# Patient Record
Sex: Female | Born: 1985 | Race: Black or African American | Hispanic: No | Marital: Single | Smoking: Never smoker
Health system: Southern US, Community
[De-identification: ages and names within clinical notes are randomized; demographics above are authoritative.]

---

## 2020-12-28 ENCOUNTER — Ambulatory Visit (HOSPITAL_COMMUNITY)
Admission: EM | Admit: 2020-12-28 | Discharge: 2020-12-28 | Disposition: A | Discharge status: Home / Self Care | Payer: No Payment, Other | Attending: Psychiatry | Admitting: Psychiatry

## 2020-12-28 ENCOUNTER — Other Ambulatory Visit: Payer: Self-pay

## 2020-12-28 DIAGNOSIS — F4323 Adjustment disorder with mixed anxiety and depressed mood: Secondary | ICD-10-CM | POA: Insufficient documentation

## 2020-12-28 DIAGNOSIS — Z5900 Homelessness unspecified: Secondary | ICD-10-CM | POA: Insufficient documentation

## 2020-12-28 NOTE — BH Assessment (Signed)
Pt states she was recently discharged from a hospital, doesn't know name of hospital, how long she was there or when discharged. She said she came to [facility] bc she saw it when walking by. She denies SI, HI and AVH. She said she wants "assistance" and "tx". Also did not have answer for what county she lives in.

## 2020-12-28 NOTE — Discharge Summary (Signed)
Madison Taylor to be D/C'd home per NP order. Discussed with the patient and all questions fully answered. An After Visit Summary was printed and given to the patient. Patient escorted home and D/C home via private auto.  Dickie La  12/28/2020 4:26 PM

## 2020-12-28 NOTE — Discharge Instructions (Signed)

## 2020-12-28 NOTE — ED Provider Notes (Signed)
[facility]al [facility] Medical Screening Exam  Patient Name: Madison Taylor MRN: [id number] Date of Evaluation: 12/28/20 Chief Complaint:   Diagnosis:  Final diagnoses:  Homelessness  Adjustment disorder with mixed anxiety and depressed mood    History of Present illness: Madison Taylor is a 35 y.o. female.  Patient presents voluntarily as a walk-in reporting that she came here to receive treatment.  Patient reports that she was recently discharged from a another hospital but does not remember the name.  She states that she has paperwork in her book bag and when she provided it to me shows that she was recently discharged from the [facility] health system.  Only medications patient was prescribed from that discharge was Norvasc for high blood pressure.  Patient reports that she left from there because they told her she had to leave because she was not coming out of her room to receive treatment.  Patient reports today that she is not suicidal nor homicidal and denies any hallucinations.  Patient states that she is wanting to receive therapy services.  She is informed that she can present to the open access at the [facility] if she is moving to the [facility] area.  Patient reports that she is homeless and that she is needing some resources for shelters in the [place] area.  Patient will be provided with shelter resources and is also provided with hours of operation for the open access at the [facility].  Psychiatric Specialty Exam  Presentation  General Appearance:Appropriate for Environment; Casual  Eye Contact:Good  Speech:Clear and Coherent; Normal Rate  Speech Volume:Normal  Handedness:Right   Mood and Affect  Mood:Euthymic; Anxious  Affect:Appropriate; Congruent   Thought Process  Thought Processes:Coherent  Descriptions of Associations:Intact  Orientation:Full (Time, Place and Person)  Thought Content:WDL    Hallucinations:None  Ideas of Reference:None  Suicidal  Thoughts:No  Homicidal Thoughts:No   Sensorium  Memory:Immediate Good; Recent Good; Remote Good  Judgment:Fair  Insight:Fair   Executive Functions  Concentration:Good  Attention Span:No data recorded Recall:Good  Fund of Knowledge:Fair  Language:Good   Psychomotor Activity  Psychomotor Activity:Normal   Assets  Assets:Communication Skills; Desire for Improvement; Housing   Sleep  Sleep:Good  Number of hours: No data recorded  No data recorded  Physical Exam: Physical Exam Vitals and nursing note reviewed.  Constitutional:      Appearance: She is well-developed.  HENT:     Head: Normocephalic.  Eyes:     Pupils: Pupils are equal, round, and reactive to light.  Cardiovascular:     Rate and Rhythm: Normal rate.  Pulmonary:     Effort: Pulmonary effort is normal.  Musculoskeletal:        General: Normal range of motion.  Neurological:     Mental Status: She is alert and oriented to person, place, and time.    Review of Systems  Constitutional: Negative.   HENT: Negative.   Eyes: Negative.   Respiratory: Negative.   Cardiovascular: Negative.   Gastrointestinal: Negative.   Genitourinary: Negative.   Musculoskeletal: Negative.   Skin: Negative.   Neurological: Negative.   Endo/Heme/Allergies: Negative.   Psychiatric/Behavioral: Negative.    Blood pressure 134/80, pulse 98, temperature 98.2 F (36.8 C), temperature source Oral, resp. rate 18, SpO2 100 %. There is no height or weight on file to calculate BMI.  Musculoskeletal: Strength & Muscle Tone: within normal limits Gait & Station: normal Patient leans: N/A   [facility] MSE Discharge Disposition for Follow up and Recommendations: Based  on my evaluation the patient does not appear to have an emergency medical condition and can be discharged with resources and follow up care in outpatient services for Individual Therapy   Maryfrances Bunnell, FNP 12/28/2020, 4:13 PM

## 2020-12-30 ENCOUNTER — Other Ambulatory Visit: Payer: Self-pay

## 2020-12-30 ENCOUNTER — Emergency Department (HOSPITAL_COMMUNITY)
Admission: EM | Admit: 2020-12-30 | Discharge: 2020-12-31 | Disposition: A | Discharge status: Home / Self Care | Payer: Medicaid Other | Attending: Emergency Medicine | Admitting: Emergency Medicine

## 2020-12-30 ENCOUNTER — Encounter (HOSPITAL_COMMUNITY): Payer: Self-pay

## 2020-12-30 DIAGNOSIS — Z5900 Homelessness unspecified: Secondary | ICD-10-CM

## 2020-12-30 DIAGNOSIS — Z20822 Contact with and (suspected) exposure to covid-19: Secondary | ICD-10-CM | POA: Insufficient documentation

## 2020-12-30 DIAGNOSIS — R4182 Altered mental status, unspecified: Secondary | ICD-10-CM | POA: Insufficient documentation

## 2020-12-30 DIAGNOSIS — F4321 Adjustment disorder with depressed mood: Secondary | ICD-10-CM | POA: Diagnosis present

## 2020-12-30 DIAGNOSIS — R000 Tachycardia, unspecified: Secondary | ICD-10-CM | POA: Insufficient documentation

## 2020-12-30 LAB — COMPREHENSIVE METABOLIC PANEL
ALT: 17 U/L (ref 0–44)
AST: 35 U/L (ref 15–41)
Albumin: 4.2 g/dL (ref 3.5–5.0)
Alkaline Phosphatase: 64 U/L (ref 38–126)
Anion gap: 14 (ref 5–15)
BUN: 13 mg/dL (ref 6–20)
CO2: 16 mmol/L — ABNORMAL LOW (ref 22–32)
Calcium: 9 mg/dL (ref 8.9–10.3)
Chloride: 105 mmol/L (ref 98–111)
Creatinine, Ser: 0.76 mg/dL (ref 0.44–1.00)
GFR, Estimated: >60 mL/min (ref >60)
Glucose, Bld: 104 mg/dL — ABNORMAL HIGH (ref 70–99)
Potassium: 4 mmol/L (ref 3.5–5.1)
Sodium: 135 mmol/L (ref 135–145)
Total Bilirubin: 2 mg/dL — ABNORMAL HIGH (ref 0.3–1.2)
Total Protein: 7.6 g/dL (ref 6.5–8.1)

## 2020-12-30 LAB — CK: Total CK: 763 U/L — ABNORMAL HIGH (ref 38–234)

## 2020-12-30 LAB — CBC WITH DIFFERENTIAL/PLATELET
Abs Immature Granulocytes: 0.06 10*3/uL (ref 0.00–0.07)
Basophils Absolute: 0 10*3/uL (ref 0.0–0.1)
Basophils Relative: 0 %
Eosinophils Absolute: 0 10*3/uL (ref 0.0–0.5)
Eosinophils Relative: 0 %
HCT: 35.6 % — ABNORMAL LOW (ref 36.0–46.0)
Hemoglobin: 11.2 g/dL — ABNORMAL LOW (ref 12.0–15.0)
Immature Granulocytes: 0 %
Lymphocytes Relative: 19 %
Lymphs Abs: 2.6 10*3/uL (ref 0.7–4.0)
MCH: 25.6 pg — ABNORMAL LOW (ref 26.0–34.0)
MCHC: 31.5 g/dL (ref 30.0–36.0)
MCV: 81.3 fL (ref 80.0–100.0)
Monocytes Absolute: 1 10*3/uL (ref 0.1–1.0)
Monocytes Relative: 8 %
Neutro Abs: 9.7 10*3/uL — ABNORMAL HIGH (ref 1.7–7.7)
Neutrophils Relative %: 73 %
Platelets: 307 10*3/uL (ref 150–400)
RBC: 4.38 MIL/uL (ref 3.87–5.11)
RDW: 15.2 % (ref 11.5–15.5)
WBC: 13.4 10*3/uL — ABNORMAL HIGH (ref 4.0–10.5)
nRBC: 0 % (ref 0.0–0.2)

## 2020-12-30 LAB — BLOOD GAS, VENOUS
Acid-base deficit: 9.6 mmol/L — ABNORMAL HIGH (ref 0.0–2.0)
Bicarbonate: 17 mmol/L — ABNORMAL LOW (ref 20.0–28.0)
O2 Saturation: 64.2 %
Patient temperature: 98.6
pCO2, Ven: 42.3 mmHg — ABNORMAL LOW (ref 44.0–60.0)
pH, Ven: 7.229 — ABNORMAL LOW (ref 7.250–7.430)
pO2, Ven: 40.7 mmHg (ref 32.0–45.0)

## 2020-12-30 LAB — HCG, SERUM, QUALITATIVE: Preg, Serum: NEGATIVE

## 2020-12-30 LAB — ETHANOL: Alcohol, Ethyl (B): <10 mg/dL (ref <10)

## 2020-12-30 LAB — SALICYLATE LEVEL: Salicylate Lvl: <7 mg/dL — ABNORMAL LOW (ref 7.0–30.0)

## 2020-12-30 LAB — ACETAMINOPHEN LEVEL: Acetaminophen (Tylenol), Serum: <10 ug/mL — ABNORMAL LOW (ref 10–30)

## 2020-12-30 MED ORDER — LORAZEPAM 2 MG/ML IJ SOLN
0.5000 mg | Freq: Once | INTRAMUSCULAR | Status: AC
  Administered 2020-12-30: 0.5 mg via INTRAVENOUS
  Filled 2020-12-30: qty 1

## 2020-12-30 MED ORDER — SODIUM CHLORIDE 0.9 % IV BOLUS
1000.0000 mL | Freq: Once | INTRAVENOUS | Status: AC
  Administered 2020-12-30: 1000 mL via INTRAVENOUS

## 2020-12-30 MED ORDER — IBUPROFEN 800 MG PO TABS
800.0000 mg | ORAL_TABLET | Freq: Once | ORAL | Status: AC
  Administered 2020-12-30: 800 mg via ORAL
  Filled 2020-12-30: qty 1

## 2020-12-30 NOTE — ED Notes (Addendum)
Pt is refusing to have labs done or any meds. Asked for some peanut butter and water. Pt has been very pleasant but does not speak much. Pt also not allowing staff to do EKG or vitals at times.

## 2020-12-30 NOTE — ED Notes (Signed)
Pt is still refusing treatment

## 2020-12-30 NOTE — ED Triage Notes (Addendum)
Pt BIB EMS. Pt decided to lay in a ditch and bystander called [employer]. Pt refused to speak to [employer] and EMS. Pt follows commands. Pt was seen at an ED on 4/1 and diagnosed with adjustment disorder.

## 2020-12-30 NOTE — ED Notes (Signed)
Patient dump her urine sample In the toilet. Pt stated "I never agreed to test my urine".

## 2020-12-30 NOTE — BH Assessment (Signed)
Contacted pt on the Tele-Assessment machine in an attempt to complete her CCA. Pt did not respond to clinician calling her name; clinician requested the assistance of pt's nurse, Junior RN, who also attempted to arouse pt without success. Clinician requested pt's nurse contact her via secure internal IM when pt awakens later; pt's nurse agreed to do so.

## 2020-12-30 NOTE — ED Provider Notes (Signed)
[facility]-EMERGENCY DEPT Provider Note   CSN: [id number] Arrival date & time: 12/30/20  1429     History Chief Complaint  Patient presents with  . Altered Mental Status    Madison Taylor is a 35 y.o. female.  Presents to ER by EMS.  History limited due to patient nonverbal status.  Patient refusing to provide any history.  Per report, patient was laying in a ditch and bystander called EMS.  Refusing to talk to [employer].  HPI     History reviewed. No pertinent past medical history.  There are no problems to display for this patient.   History reviewed. No pertinent surgical history.   OB History   No obstetric history on file.     History reviewed. No pertinent family history.     Home Medications Prior to Admission medications   Not on File    Allergies    Patient has no known allergies.  Review of Systems   Review of Systems  Unable to perform ROS: Patient nonverbal    Physical Exam Updated Vital Signs BP (!) 142/110   Pulse 95   Temp 98.1 F (36.7 C) (Oral)   Resp 17   SpO2 100%   Physical Exam Vitals and nursing note reviewed.  Constitutional:      General: She is not in acute distress.    Appearance: She is well-developed.  HENT:     Head: Normocephalic and atraumatic.  Eyes:     Conjunctiva/sclera: Conjunctivae normal.  Cardiovascular:     Rate and Rhythm: Regular rhythm. Tachycardia present.     Heart sounds: No murmur heard.   Pulmonary:     Effort: Pulmonary effort is normal. No respiratory distress.     Breath sounds: Normal breath sounds.  Abdominal:     Palpations: Abdomen is soft.     Tenderness: There is no abdominal tenderness.  Musculoskeletal:     Cervical back: Neck supple.  Skin:    General: Skin is warm and dry.  Neurological:     Mental Status: She is alert.     Comments: Alert, follows commands, does not speak     ED Results / Procedures / Treatments   Labs (all labs ordered are listed, but  only abnormal results are displayed) Labs Reviewed  CBC WITH DIFFERENTIAL/PLATELET - Abnormal; Notable for the following components:      Result Value   WBC 13.4 (*)    Hemoglobin 11.2 (*)    HCT 35.6 (*)    MCH 25.6 (*)    Neutro Abs 9.7 (*)    All other components within normal limits  COMPREHENSIVE METABOLIC PANEL - Abnormal; Notable for the following components:   CO2 16 (*)    Glucose, Bld 104 (*)    Total Bilirubin 2.0 (*)    All other components within normal limits  ACETAMINOPHEN LEVEL - Abnormal; Notable for the following components:   Acetaminophen (Tylenol), Serum <10 (*)    All other components within normal limits  SALICYLATE LEVEL - Abnormal; Notable for the following components:   Salicylate Lvl <7.0 (*)    All other components within normal limits  ETHANOL  HCG, SERUM, QUALITATIVE  URINALYSIS, ROUTINE W REFLEX MICROSCOPIC  RAPID URINE DRUG SCREEN, HOSP PERFORMED  CK  LACTIC ACID, PLASMA  LACTIC ACID, PLASMA  BLOOD GAS, VENOUS  POC URINE PREG, ED    EKG None  Radiology No results found.  Procedures Procedures   Medications Ordered in ED Medications  LORazepam (ATIVAN) injection 0.5 mg (0.5 mg Intravenous Given 12/30/20 1932)  sodium chloride 0.9 % bolus 1,000 mL (0 mLs Intravenous Stopped 12/30/20 2124)  ibuprofen (ADVIL) tablet 800 mg (800 mg Oral Given 12/30/20 1933)  sodium chloride 0.9 % bolus 1,000 mL (1,000 mLs Intravenous New Bag/Given 12/30/20 2038)    ED Course  I have reviewed the triage vital signs and the nursing notes.  Pertinent labs & imaging results that were available during my care of the patient were reviewed by me and considered in my medical decision making (see chart for details).    MDM Rules/Calculators/A&P                         35 year old presents to ER after she was picked up by EMS lying in a ditch.  In ER, she initially was refusing to speak.  Noted to be tachycardic but otherwise vitals were stable.  She eventually did  speak albeit very little.  She did deny SI or HI or AVH.  Initially refusing labs but later was willing to accept lab work.  She was noted to have mild leukocytosis as well as a mildly low bicarbonate level.  Suspect dehydration.  Provided fluids.  Patient refusing to speak with TTS.  On reassessment, she still would not provide any additional history.  Will broaden work-up for altered mental status and possible acidosis.  Will check VBG, lactate, CK, CT head.  While awaiting further testing and reassessment, patient signed out to Dr. Adela Lank.  Final Clinical Impression(s) / ED Diagnoses Final diagnoses:  Altered mental status, unspecified altered mental status type    Rx / DC Orders ED Discharge Orders    None       Milagros Loll, MD 12/30/20 [phone]

## 2020-12-30 NOTE — BH Assessment (Signed)
TTS attempted to see this patient for assessment.  Requested RN-Ellen to set up TTS cart for patient at 16:50 pm and even spoke to Darl Pikes , Consulting civil engineer to help get cart set up at 17:40.  The cart has still not been set up at his time. (18:05)

## 2020-12-30 NOTE — ED Notes (Signed)
TTS machine has been in room for an hour, pt has covers pulled up over head and did not want to talk when writer took TTS machine in room.

## 2020-12-31 ENCOUNTER — Other Ambulatory Visit (HOSPITAL_COMMUNITY): Payer: Medicaid Other

## 2020-12-31 ENCOUNTER — Emergency Department (HOSPITAL_COMMUNITY): Payer: Medicaid Other

## 2020-12-31 DIAGNOSIS — F4321 Adjustment disorder with depressed mood: Secondary | ICD-10-CM | POA: Diagnosis present

## 2020-12-31 LAB — BASIC METABOLIC PANEL
Anion gap: 8 (ref 5–15)
BUN: 9 mg/dL (ref 6–20)
CO2: 17 mmol/L — ABNORMAL LOW (ref 22–32)
Calcium: 7.7 mg/dL — ABNORMAL LOW (ref 8.9–10.3)
Chloride: 113 mmol/L — ABNORMAL HIGH (ref 98–111)
Creatinine, Ser: 0.52 mg/dL (ref 0.44–1.00)
GFR, Estimated: >60 mL/min (ref >60)
Glucose, Bld: 73 mg/dL (ref 70–99)
Potassium: 3.8 mmol/L (ref 3.5–5.1)
Sodium: 138 mmol/L (ref 135–145)

## 2020-12-31 LAB — RESP PANEL BY RT-PCR (FLU A&B, COVID) ARPGX2
Influenza A by PCR: NEGATIVE
Influenza B by PCR: NEGATIVE
SARS Coronavirus 2 by RT PCR: NEGATIVE

## 2020-12-31 LAB — LACTIC ACID, PLASMA
Lactic Acid, Venous: 1.4 mmol/L (ref 0.5–1.9)
Lactic Acid, Venous: 0.7 mmol/L (ref 0.5–1.9)

## 2020-12-31 LAB — BETA-HYDROXYBUTYRIC ACID: Beta-Hydroxybutyric Acid: 2.03 mmol/L — ABNORMAL HIGH (ref 0.05–0.27)

## 2020-12-31 MED ORDER — IBUPROFEN 800 MG PO TABS
800.0000 mg | ORAL_TABLET | Freq: Once | ORAL | Status: AC
  Administered 2020-12-31: 800 mg via ORAL
  Filled 2020-12-31: qty 1

## 2020-12-31 MED ORDER — LACTATED RINGERS IV BOLUS
1000.0000 mL | Freq: Once | INTRAVENOUS | Status: AC
  Administered 2020-12-31: 1000 mL via INTRAVENOUS

## 2020-12-31 NOTE — Progress Notes (Addendum)
..   Transition of Care [facility]) - Emergency Department Mini Assessment   Patient Details  Name: Madison Taylor MRN: [id number] Date of Birth: Nov 12, 1985  Transition of [facility]) CM/SW Contact:    Lossie Faes Tarpley-Carter, LCSWA Phone Number: 12/31/2020, 1:38 PM   Clinical Narrative: TOC CSW spoke with Alvis Lemmings, Charity fundraiser. Dawn would give pt resources to assist with homelessness.  Dosia Yodice Tarpley-Carter, MSW, LCSW-A Pronouns:  She, Her, Hers                  [facility] ED Transitions of CareClinical Social Worker [email or web address] [phone]  ED Mini Assessment: What brought you to the Emergency Department? : Altered Mental Status  Barriers to Discharge: No Barriers Identified     Means of departure: Not know  Interventions which prevented an admission or readmission: Homeless Screening    Patient Contact and Communications        ,          Patient states their goals for this hospitalization and ongoing recovery are:: To obtain resources for homelessness. CMS Medicare.gov Compare Post Acute Care list provided to:: Patient Choice offered to / list presented to : NA  Admission diagnosis:  AMS Patient Active Problem List   Diagnosis Date Noted  . Adjustment disorder with depressed mood 12/31/2020   PCP:  Pcp, No Pharmacy:   [id number] - [place], [place] - [address] AT [facility] [address] [place] [address] Phone: [phone] Fax: [phone]

## 2020-12-31 NOTE — TOC Initial Note (Addendum)
12/31/2020 600 pm   Transition of Care (TOC) - Initial/Assessment Note    Patient Details  Name: Madison Taylor MRN: [id number] Date of Birth: [birth year]  [facility]) CM/SW Contact:    Elliot Cousin, RN Phone Number: [phone] 12/31/2020, 9:19 PM  Clinical Narrative:                 TOC CM spoke to pt at bedside. Pt was tearful and guarded. States she does not want to leave.  Attempted to discuss with pt family support, income or job. Pt was did not provided any information on family or friends that can assist her in the community. States she slept on the street in a ditch. Pt does have list of shelter resources. Provided pt with bus pass, 3 water bottles, sandwich, crackers and $5 to assist with getting water. Pt gave permission for [facility] to assist with homeless and food.     Barriers to Discharge: No Barriers Identified   Patient Goals and CMS Choice Patient states their goals for this hospitalization and ongoing recovery are:: To obtain resources for homelessness. CMS Medicare.gov Compare Post Acute Care list provided to:: Patient Choice offered to / list presented to : NA  Expected Discharge Plan and Services    Prior Living Arrangements/Services  Activities of Daily Living      Permission Sought/Granted  Emotional Assessment              Admission diagnosis:  AMS Patient Active Problem List   Diagnosis Date Noted  . Adjustment disorder with depressed mood 12/31/2020   PCP:  Pcp, No Pharmacy:   [id number] - [place], [place] - [address] AT [facility] [address] [place] [address] Phone: [phone] Fax: [phone]     Social Determinants of Health (SDOH) Interventions    Readmission Risk Interventions No flowsheet data found.

## 2020-12-31 NOTE — Progress Notes (Signed)
TOC CM created [facility] referral. Isidoro Donning RN CCM, [facility] ED TOC CM [phone]

## 2020-12-31 NOTE — Discharge Instructions (Signed)
Please return for any problem.  °

## 2020-12-31 NOTE — BH Assessment (Signed)
Comprehensive Clinical Assessment (CCA) Note  12/31/2020 Madison Taylor [id number]  Recommendations for Services/Supports/Treatments: Melbourne Abts, PA, reviewed pt's chart and information and determined pt should be observed in the ED for safety and stability and re-assessed in the morning by psychiatry. This information was relayed to pt's nurse, Dawn RN at [phone].  The patient demonstrates the following risk factors for suicide: Chronic risk factors for suicide include: psychiatric disorder of Adjustment Disorder. Acute risk factors for suicide include: recent discharge from inpatient psychiatry. Protective factors for this patient include: hope for the future. Considering these factors, the overall suicide risk at this point appears to be none. Patient is appropriate for outpatient follow up.  Therefore, no sitter is deemed necessary for suicide precautions.   Flowsheet Row ED from 12/30/2020 in [facility]Y DE[facility]  C-SSRS RISK CATEGORY No Risk      Chief Complaint:  Chief Complaint  Patient presents with  . Altered Mental Status   Visit Diagnosis: F43.25, Adjustment disorder, With mixed disturbance of emotions and conduct  CCA Screening, Triage and Referral (STR) Madison Taylor is a 35 year old patient who was brought to the [facility] via EMS due to pt lying in a ditch, resulting in a bystander calling [employer]. "Pt refused to speak to [employer] and EMS. "Pt follows commands. Pt was seen at an ED on 4/01 and dx with adjustment disorder."  Pt did not initially answer questions and required multiple re-directions to interact. When asked why she was in the hospital, she stated, "I'm dehydrated." When asked what we can do to help, pt stated, "just give me some medicine to see if that works. I've had pains all day. I was given ibuprofen." When asked if the medicine helped, she stated, "last night I slept so I don't know if it did but I just got up a few minutes ago and I still hurt."  Pt  denies SI or a hx of SI. She denies HI, AVH, NSSIB, access to guns/weapons, engagement with the legal system, or SA. As of now, pt has not provided a urine sample for a UDA.  Pt's orientation and memory were UTA. Pt was flat and required multiple re-directions to stay awake to complete the assessment. Pt's insight, judgment, and impulse control is fair - poor at this time.    Patient Reported Information How did you hear about [place]? Other (Comment) (EMS)  Referral name: EMS  Referral phone number: 0 (N/A)   Whom do you see for routine medical problems? I don't have a doctor  Practice/Facility Name: No data recorded Practice/Facility Phone Number: No data recorded Name of Contact: No data recorded Contact Number: No data recorded Contact Fax Number: No data recorded Prescriber Name: No data recorded Prescriber Address (if known): No data recorded  What Is the Reason for Your Visit/Call Today? Per nursing note: "Pt BIB EMS. Pt decided to lay in a ditch and bystander called [employer]. Pt refused to speak to [employer] and EMS. Pt follows commands. Pt was seen at an ED on 4/1 and diagnosed with adjustment disorder."  How Long Has This Been Causing You Problems? <Week  What Do You Feel Would Help You the Most Today? -- (Pt shares she is in pain and needs some ibuprofen.)   Have You Recently Been in Any Inpatient Treatment (Hospital/Detox/Crisis Center/28-Day Program)? Yes  Name/Location of Program/Hospital:[facility]  How Long Were You There? Unsure  When Were You Discharged?  (Within the last week or so)   [facility] You [facility]? No data recorded [facility]? No data recorded  Have You Recently Had Any Thoughts About Hurting Yourself? No  Are You Planning to Commit Suicide/Harm Yourself At This time? No   Have you Recently Had Thoughts About Hurting Someone Karolee Ohslse? No  Explanation: No data recorded  Have You Used Any Alcohol or Drugs in the  Past 24 Hours? No  How Long Ago Did You Use Drugs or Alcohol? No data recorded What Did You Use and How Much? No data recorded  Do You Currently Have a Therapist/Psychiatrist? No  Name of Therapist/Psychiatrist: No data recorded  Have You Been Recently Discharged From Any Office Practice or Programs? No  Explanation of Discharge From Practice/Program: No data recorded    CCA Screening Triage Referral Assessment Type of Contact: Tele-Assessment  Is this Initial or Reassessment? Initial Assessment  Date Telepsych consult ordered in [facility]:  12/30/2020  [facility]  [facility]   Patient Reported Information Reviewed? Yes  Patient Left Without Being Seen? No data recorded Reason for Not Completing Assessment: No data recorded  Collateral Involvement: Pt denies she has friends/family that clinician can contact for collateral information.   Does Patient Have a Automotive engineerCourt Appointed Legal Guardian? No data recorded Name and Contact of Legal Guardian: No data recorded If Minor and Not Living with Parent(s), Who has Custody? N/A  Is CPS involved or ever been involved? -- (UTA)  Is APS involved or ever been involved? -- (UTA)   Patient Determined To Be At Risk for Harm To Self or Others Based on Review of Patient Reported Information or Presenting Complaint? No  Method: No data recorded Availability of Means: No data recorded Intent: No data recorded Notification Required: No data recorded Additional Information for Danger to Others Potential: No data recorded Additional Comments for Danger to Others Potential: No data recorded Are There Guns or Other Weapons in Your Home? No data recorded Types of Guns/Weapons: No data recorded Are These Weapons Safely Secured?                            No data recorded Who Could Verify You Are Able To Have These Secured: No data recorded Do You Have any Outstanding Charges, Pending Court Dates, Parole/Probation? No data  recorded Contacted To Inform of Risk of Harm To Self or Others: -- (N/A)   Location of Assessment: [facility] ED   Does Patient Present under Involuntary Commitment? No  IVC Papers Initial File Date: No data recorded  [place]   Patient Currently Receiving the Following Services: Not Receiving Services   Determination of Need: Urgent (48 hours)   Options For Referral: Outpatient Therapy     CCA Biopsychosocial Intake/Chief Complaint:  Per nursing note: "Pt BIB EMS. Pt decided to lay in a ditch and bystander called [employer]. Pt refused to speak to [employer] and EMS. Pt follows commands. Pt was seen at an ED on 4/1 and diagnosed with adjustment disorder."  Current Symptoms/Problems: Pt states she is in pain.   Patient Reported Schizophrenia/Schizoaffective Diagnosis in Past: No   Strengths: Not assessed  Preferences: Not assessed  Abilities: Not assessed   Type of Services Patient Feels are Needed: Pt expressed needing some ibuprofen for pain; she also shared she was cold and hungry.   Initial Clinical Notes/Concerns: N/A   Mental Health Symptoms Depression:  None   Duration of  Depressive symptoms: No data recorded  Mania:  Recklessness   Anxiety:   None   Psychosis:  Affective flattening/alogia/avolition; Grossly disorganized speech; Other negative symptoms   Duration of Psychotic symptoms: Less than six months   Trauma:  None   Obsessions:  None   Compulsions:  None   Inattention:  None   Hyperactivity/Impulsivity:  N/A   Oppositional/Defiant Behaviors:  None   Emotional Irregularity:  Potentially harmful impulsivity   Other Mood/Personality Symptoms:  None noted    Mental Status Exam Appearance and self-care  Stature:  Average   Weight:  Average weight   Clothing:  -- (Pt was dressed in scrubs)   Grooming:  Normal   Cosmetic use:  None   Posture/gait:  Normal   Motor activity:  Not Remarkable   Sensorium  Attention:   Inattentive   Concentration:  Variable   Orientation:  -- (UTA)   Recall/memory:  -- (Pt was slow to answer questions/provide information)   Affect and Mood  Affect:  Blunted; Depressed; Flat   Mood:  Depressed   Relating  Eye contact:  Avoided   Facial expression:  Constricted   Attitude toward examiner:  Guarded   Thought and Language  Speech flow: Slow   Thought content:  Appropriate to Mood and Circumstances   Preoccupation:  None   Hallucinations:  None   Organization:  No data recorded  [employer] of Knowledge:  Average   Intelligence:  Average   Abstraction:  -- (UTA)   Judgement:  Impaired   Reality Testing:  -- (UTA)   Insight:  Gaps   Decision Making:  Impulsive   Social Functioning  Social Maturity:  Impulsive   Social Judgement:  Heedless   Stress  Stressors:  -- (UTA)   Coping Ability:  -- Industrial/product designer)   Skill Deficits:  Communication; Self-care; Self-control   Supports:  -- Industrial/product designer)     Religion: Religion/Spirituality Are You A Religious Person?:  (Not assessed) How Might This Affect Treatment?: Not assessed  Leisure/Recreation: Leisure / Recreation Do You Have Hobbies?:  (Not assessed)  Exercise/Diet: Exercise/Diet Do You Exercise?:  (Not assessed) Have You Gained or Lost A Significant Amount of Weight in the Past Six Months?:  (Not assessed) Do You Follow a Special Diet?:  (Not assessed) Do You Have Any Trouble Sleeping?:  (Not assessed)   CCA Employment/Education Employment/Work Situation: Employment / Work Situation Employment situation:  (Not assessed) Patient's job has been impacted by current illness:  (Not assessed) What is the longest time patient has a held a job?: Not assessed Where was the patient employed at that time?: Not assessed Has patient ever been in the [employer]?:  (Not assessed)  Education: Education Is Patient Currently Attending School?:  (Not assessed) Last Grade Completed:  (Not  assessed) Name of High School: Not assessed Did 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[employer]?:  (Not assessed) Did You Attend College?:  (Not assessed) Did You Have Any Special Interests In School?: Not assessed Did You Have An Individualized Education Program (IIEP):  (Not assessed) Did You Have Any Difficulty At School?:  (Not assessed) Patient's Education Has Been Impacted by Current Illness:  (Not assessed)   CCA Family/Childhood History Family and Relationship History: Family history Marital status:  (Not assessed) Are you sexually active?:  (Not assessed) What is your sexual orientation?: Not assessed Has your sexual activity been affected by drugs, alcohol, medication, or emotional stress?: Not assessed Does patient have children?:  (Not assessed)  Childhood  History:  Childhood History By whom was/is the patient raised?:  (Not assessed) Additional childhood history information: Not assessed Description of patient's relationship with caregiver when they were a child: Not assessed Patient's description of current relationship with people who raised him/her: Not assessed How were you disciplined when you got in trouble as a child/adolescent?: Not assessed Does patient have siblings?:  (Not assessed) Did patient suffer any verbal/emotional/physical/sexual abuse as a child?:  (Not assessed) Did patient suffer from severe childhood neglect?:  (Not assessed) Has patient ever been sexually abused/assaulted/raped as an adolescent or adult?:  (Not assessed) Was the patient ever a victim of a crime or a disaster?:  (Not assessed) Witnessed domestic violence?:  (Not assessed) Has patient been affected by domestic violence as an adult?:  (Not assessed)  Child/Adolescent Assessment:     CCA Substance Use Alcohol/Drug Use: Alcohol / Drug Use Pain Medications: Please see MAR Prescriptions: Please see MAR Over the Counter: Please see MAR History of alcohol / drug use?:  (UTA - pt denies SA but has  not yet provided a urine sample) Longest period of sobriety (when/how long): Unknown Negative Consequences of Use:  (Unknown) Withdrawal Symptoms:  (Unknown)                         ASAM's:  Six Dimensions of Multidimensional Assessment  Dimension 1:  Acute Intoxication and/or Withdrawal Potential:      Dimension 2:  Biomedical Conditions and Complications:      Dimension 3:  Emotional, Behavioral, or Cognitive Conditions and Complications:     Dimension 4:  Readiness to Change:     Dimension 5:  Relapse, Continued use, or Continued Problem Potential:     Dimension 6:  Recovery/Living Environment:     ASAM Severity Score:    ASAM Recommended Level of Treatment: ASAM Recommended Level of Treatment:  (Unknown)   Substance use Disorder (SUD) Substance Use Disorder (SUD)  Checklist Symptoms of Substance Use:  (Unknown)  Recommendations for Services/Supports/Treatments: Recommendations for Services/Supports/Treatments Recommendations For Services/Supports/Treatments: Other (Comment) (Observation at [facility])  Melbourne Abts, PA, reviewed pt's chart and information and determined pt should be observed in the ED for safety and stability and re-assessed in the morning by psychiatry. This information was relayed to pt's nurse, Dawn RN at [phone].  DSM5 Diagnoses: F43.25, Adjustment disorder, With mixed disturbance of emotions and conduct  Patient Centered Plan: Patient is on the following Treatment Plan(s):  None   Referrals to Alternative Service(s): Referred to Alternative Service(s):   Place:   Date:   Time:    Referred to Alternative Service(s):   Place:   Date:   Time:    Referred to Alternative Service(s):   Place:   Date:   Time:    Referred to Alternative Service(s):   Place:   Date:   Time:     Ralph Dowdy, LMFT

## 2020-12-31 NOTE — ED Provider Notes (Signed)
I received the patient in signout from Dr. Stevie Kern.  Briefly the patient is a 35 year old female with a chief complaints of altered mental status.  Found to have a metabolic acidosis.  Plan for continued fluid administration further laboratory evaluation to try and assess for the cause of her acidosis.  VBG with a pH of 723.  Likely ketoacidosis with negative lactate alcohol negative beta hydroxybutyric acid of 2.  We will give 3rd L of IV fluids and recheck a metabolic panel.  I discussed the lab results with the patient.  She denies any toxic alcohol ingestion.  States that she has been able to eat and drink somewhat.  Describes aching all over her body.  Was given ibuprofen yesterday with some improvement.  Bicarb very mildly improved, now hyperchloremic, which could be complicating results.  Patient seems to have improved mentally.  ?ingestion.  As the patient was recently seen with concern for acute mental illness and referred for outpatient.  Will have TTS Ledon Snare, DO 12/31/20 2001

## 2020-12-31 NOTE — ED Notes (Signed)
Pt refusing to leave, educated on dc instructions and homeless resources

## 2020-12-31 NOTE — Consult Note (Signed)
Telepsych Consultation   Reason for Consult:  EDP request  Consult for altered mental state on admission. Tele assessment Referring Physician:  EDP Location of Patient: [facility] Location of Provider: [facility]  Patient Identification: Madison Taylor MRN:  [id number] Principal Diagnosis: Adjustment disorder with depressed mood Diagnosis:  Principal Problem:   Adjustment disorder with depressed mood   Total Time spent with patient: 20 minutes  Subjective:   Madison Taylor is a 35 y.o. female seen today per tele visit. Patient reports that she came to hospital for "physical and emotional treatment and that her body hurts all over".   Denies suicidal and homicidal ideation. Denies auditory and visual ideation. Denies any alcohol or drug use.  Denies any home medications. States, " I do not want to take medications and that's all any one wants to do is to put me on medications".  Patient states that she lives alone.When asked if homeless, patient declined to answer. She did agree to homeless resources from [employer].  CSW will follow up with homeless shelter resources.   Patient declines to give any collateral information. States, " I do not have anyone that I want you to call".  HPI:    EDP provider note, "Madison Taylor is a 35 y.o. female.  Presents to ER by EMS. History limited due to patient nonverbal status.  Patient refusing to provide any history. Per report, patient was laying in a ditch and bystander called EMS.  Refusing to talk to [employer]".  Past Psychiatric History:   Risk to Self:   Risk to Others:   Prior Inpatient Therapy:   Prior Outpatient Therapy:    Past Medical History: History reviewed. No pertinent past medical history. History reviewed. No pertinent surgical history. Family History: History reviewed. No pertinent family history. Family Psychiatric  History:  Social History:  Social History   Substance and Sexual Activity  Alcohol  Use None     Social History   Substance and Sexual Activity  Drug Use Not on file    Social History   Socioeconomic History  . Marital status: Single    Spouse name: Not on file  . Number of children: Not on file  . Years of education: Not on file  . Highest education level: Not on file  Occupational History  . Not on file  Tobacco Use  . Smoking status: Not on file  . Smokeless tobacco: Not on file  Substance and Sexual Activity  . Alcohol use: Not on file  . Drug use: Not on file  . Sexual activity: Not on file  Other Topics Concern  . Not on file  Social History Narrative  . Not on file   Social Determinants of Health   Financial Resource Strain: Not on file  Food Insecurity: Not on file  Transportation Needs: Not on file  Physical Activity: Not on file  Stress: Not on file  Social Connections: Not on file   Additional Social History:    Allergies:  No Known Allergies  Labs:  Results for orders placed or performed during the hospital encounter of 12/30/20 (from the past 48 hour(s))  CBC with Differential     Status: Abnormal   Collection Time: 12/30/20  7:20 PM  Result Value Ref Range   WBC 13.4 (H) 4.0 - 10.5 K/uL   RBC 4.38 3.87 - 5.11 MIL/uL   Hemoglobin 11.2 (L) 12.0 - 15.0 g/dL   HCT 82.9 (L) 56.2 - 13.0 %  MCV 81.3 80.0 - 100.0 fL   MCH 25.6 (L) 26.0 - 34.0 pg   MCHC 31.5 30.0 - 36.0 g/dL   RDW 38.1 82.9 - 93.7 %   Platelets 307 150 - 400 K/uL   nRBC 0.0 0.0 - 0.2 %   Neutrophils Relative % 73 %   Neutro Abs 9.7 (H) 1.7 - 7.7 K/uL   Lymphocytes Relative 19 %   Lymphs Abs 2.6 0.7 - 4.0 K/uL   Monocytes Relative 8 %   Monocytes Absolute 1.0 0.1 - 1.0 K/uL   Eosinophils Relative 0 %   Eosinophils Absolute 0.0 0.0 - 0.5 K/uL   Basophils Relative 0 %   Basophils Absolute 0.0 0.0 - 0.1 K/uL   Immature Granulocytes 0 %   Abs Immature Granulocytes 0.06 0.00 - 0.07 K/uL    Comment: Performed at [facility], [address], [place], [place] [address]  Comprehensive metabolic panel     Status: Abnormal   Collection Time: 12/30/20  7:20 PM  Result Value Ref Range   Sodium 135 135 - 145 mmol/L   Potassium 4.0 3.5 - 5.1 mmol/L   Chloride 105 98 - 111 mmol/L   CO2 16 (L) 22 - 32 mmol/L   Glucose, Bld 104 (H) 70 - 99 mg/dL    Comment: Glucose reference range applies only to samples taken after fasting for at least 8 hours.   BUN 13 6 - 20 mg/dL   Creatinine, Ser 8.93 0.44 - 1.00 mg/dL   Calcium 9.0 8.9 - 81.0 mg/dL   Total Protein 7.6 6.5 - 8.1 g/dL   Albumin 4.2 3.5 - 5.0 g/dL   AST 35 15 - 41 U/L   ALT 17 0 - 44 U/L   Alkaline Phosphatase 64 38 - 126 U/L   Total Bilirubin 2.0 (H) 0.3 - 1.2 mg/dL   GFR, Estimated >17 >51 mL/min    Comment: (NOTE) Calculated using the CKD-EPI Creatinine Equation (2021)    Anion gap 14 5 - 15    Comment: Performed at [facility], [address], [place], [place] [address]  Ethanol     Status: None   Collection Time: 12/30/20  7:20 PM  Result Value Ref Range   Alcohol, Ethyl (B) <10 <10 mg/dL    Comment: (NOTE) Lowest detectable limit for serum alcohol is 10 mg/dL.  For medical purposes only. Performed at [facility], [address], [place], [place] [address]   Acetaminophen level     Status: Abnormal   Collection Time: 12/30/20  7:20 PM  Result Value Ref Range   Acetaminophen (Tylenol), Serum <10 (L) 10 - 30 ug/mL    Comment: (NOTE) Therapeutic concentrations vary significantly. A range of 10-30 ug/mL  may be an effective concentration for many patients. However, some  are best treated at concentrations outside of this range. Acetaminophen concentrations >150 ug/mL at 4 hours after ingestion  and >50 ug/mL at 12 hours after ingestion are often associated with  toxic reactions.  Performed at [facility], [address], [place], [place] [address]   Salicylate level     Status: Abnormal   Collection  Time: 12/30/20  7:20 PM  Result Value Ref Range   Salicylate Lvl <7.0 (L) 7.0 - 30.0 mg/dL    Comment: Performed at [facility], [address], [place], [place] [address]  hCG, serum, qualitative     Status: None   Collection Time: 12/30/20  7:20 PM  Result Value Ref Range   Preg, Serum NEGATIVE NEGATIVE    Comment: Performed at [facility], [address], [place], [place] [address]  CK     Status: Abnormal   Collection Time: 12/30/20 10:31 PM  Result Value Ref Range   Total CK 763 (H) 38 - 234 U/L    Comment: Performed at [facility], [address], [place], [place] [address]  Blood gas, venous (at [facility] and [facility], not at [facility])     Status: Abnormal   Collection Time: 12/30/20 10:55 PM  Result Value Ref Range   pH, Ven 7.229 (L) 7.250 - 7.430   pCO2, Ven 42.3 (L) 44.0 - 60.0 mmHg   pO2, Ven 40.7 32.0 - 45.0 mmHg   Bicarbonate 17.0 (L) 20.0 - 28.0 mmol/L   Acid-base deficit 9.6 (H) 0.0 - 2.0 mmol/L   O2 Saturation 64.2 %   Patient temperature 98.6     Comment: Performed at [facility], [address] [place], [place] [address]  Beta-hydroxybutyric acid     Status: Abnormal   Collection Time: 12/30/20 11:14 PM  Result Value Ref Range   Beta-Hydroxybutyric Acid 2.03 (H) 0.05 - 0.27 mmol/L    Comment: Performed at [facility], [address], [place], [place] [address]  Lactic acid, plasma     Status: None   Collection Time: 12/30/20 11:33 PM  Result Value Ref Range   Lactic Acid, Venous 1.4 0.5 - 1.9 mmol/L    Comment: Performed at [facility], [address], [place], [place] [address]  Lactic acid, plasma     Status: None   Collection Time: 12/31/20 12:33 AM  Result Value Ref Range   Lactic Acid, Venous 0.7 0.5 - 1.9 mmol/L    Comment: Performed at [facility], [address] [place], [place] [address]  Resp Panel by RT-PCR (Flu A&B, Covid)  Nasopharyngeal Swab     Status: None   Collection Time: 12/31/20  4:33 AM   Specimen: Nasopharyngeal Swab; Nasopharyngeal(NP) swabs in vial transport medium  Result Value Ref Range   SARS Coronavirus 2 by RT PCR NEGATIVE NEGATIVE    Comment: (NOTE) SARS-CoV-2 target nucleic acids are NOT DETECTED.  The SARS-CoV-2 RNA is generally detectable in upper respiratory specimens during the acute phase of infection. The lowest concentration of SARS-CoV-2 viral copies this assay can detect is 138 copies/mL. A negative result does not preclude SARS-Cov-2 infection and should not be used as the sole basis for treatment or other patient management decisions. A negative result may occur with  improper specimen collection/handling, submission of specimen other than nasopharyngeal swab, presence of viral mutation(s) within the areas targeted by this assay, and inadequate number of viral copies(<138 copies/mL). A negative result must be combined with clinical observations, patient history, and epidemiological information. The expected result is Negative.  Fact Sheet for Patients:  BloggerCourse.com  Fact Sheet for Healthcare Providers:  SeriousBroker.it  This test is no t yet approved or cleared by the [place] FDA and  has been authorized for detection and/or diagnosis of SARS-CoV-2 by FDA under an Emergency Use Authorization (EUA). This EUA will remain  in effect (meaning this test can be used) for the duration of the COVID-19 declaration under Section 564(b)(1) of the Act, 21 U.S.C.section 360bbb-3(b)(1), unless the authorization is terminated  or revoked sooner.       Influenza A by PCR NEGATIVE NEGATIVE  Influenza B by PCR NEGATIVE NEGATIVE    Comment: (NOTE) The Xpert Xpress SARS-CoV-2/FLU/RSV plus assay is intended as an aid in the diagnosis of influenza from Nasopharyngeal swab specimens and should not be used as a sole basis for  treatment. Nasal washings and aspirates are unacceptable for Xpert Xpress SARS-CoV-2/FLU/RSV testing.  Fact Sheet for Patients: BloggerCourse.com  Fact Sheet for Healthcare Providers: SeriousBroker.it  This test is not yet approved or cleared by the [place] FDA and has been authorized for detection and/or diagnosis of SARS-CoV-2 by FDA under an Emergency Use Authorization (EUA). This EUA will remain in effect (meaning this test can be used) for the duration of the COVID-19 declaration under Section 564(b)(1) of the Act, 21 U.S.C. section 360bbb-3(b)(1), unless the authorization is terminated or revoked.  Performed at [facility], [address] [place], [place] [address]   Basic metabolic panel     Status: Abnormal   Collection Time: 12/31/20  4:47 AM  Result Value Ref Range   Sodium 138 135 - 145 mmol/L   Potassium 3.8 3.5 - 5.1 mmol/L   Chloride 113 (H) 98 - 111 mmol/L   CO2 17 (L) 22 - 32 mmol/L   Glucose, Bld 73 70 - 99 mg/dL    Comment: Glucose reference range applies only to samples taken after fasting for at least 8 hours.   BUN 9 6 - 20 mg/dL   Creatinine, Ser 2.22 0.44 - 1.00 mg/dL   Calcium 7.7 (L) 8.9 - 10.3 mg/dL   GFR, Estimated >97 >98 mL/min    Comment: (NOTE) Calculated using the CKD-EPI Creatinine Equation (2021)    Anion gap 8 5 - 15    Comment: Performed at [facility], [address], [place], [place] [address]    Medications:  No current facility-administered medications for this encounter.   No current outpatient medications on file.    Musculoskeletal: Strength & Muscle Tone: within normal limits Gait & Station: normal Patient leans: N/A  Psychiatric Specialty Exam: Physical Exam Neurological:     Mental Status: She is alert.  Psychiatric:        Attention and Perception: Attention normal. She does not perceive auditory or visual  hallucinations.        Mood and Affect: Mood is not depressed (reports she "is a little sad, but doesnt know why").        Speech: Speech is delayed (delayed at times, reports she is sleepy. Voice is clear and norma rate).        Behavior: Behavior is not slowed (states she is sleepy today ). Behavior is cooperative.        Thought Content: Thought content is not delusional. Thought content does not include homicidal or suicidal ideation.        Cognition and Memory: Cognition normal.     Review of Systems  Psychiatric/Behavioral: Positive for sleep disturbance (reports no issues with sleep but states she is sleepy today ). Negative for agitation, hallucinations and suicidal ideas.    Blood pressure (!) 146/101, pulse 77, temperature 97.8 F (36.6 C), temperature source Oral, resp. rate 18, SpO2 99 %.There is no height or weight on file to calculate BMI.  General Appearance: Fairly Groomed  Eye Contact:  Minimal  Speech:  Slow  Volume:  Decreased  Mood:  Depressed  Affect:  Congruent  Thought Process:  Goal Directed  Orientation:  Full (Time, Place, and Person)  Thought Content:  WDL  Suicidal Thoughts:  No  Homicidal Thoughts:  No  Memory:  Immediate;   Good  Judgement:  Fair  Insight:  Fair  Psychomotor Activity:  Normal  Concentration:  Concentration: Good  Recall:  Good  Fund of Knowledge:  Good  Language:  Good  Akathisia:  Negative  Handed:  Right  AIMS (if indicated):     Assets:  Desire for Improvement Leisure Time Physical Health Resilience  ADL's:  Intact  Cognition:  WNL  Sleep:   reports no issues with sleep, but she is sleepy today      Recommend Discharge  CSW to follow up with homeless shelter resources   Patient declines to provide collateral information.   Disposition: No evidence of imminent risk to self or others at present.   Patient does not meet criteria for psychiatric inpatient admission. Supportive therapy provided about ongoing  stressors. Refer to IOP.  This service was provided via telemedicine using a 2-way, interactive audio and video technology.  Names of all persons participating in this telemedicine service and their role in this encounter. Name: Everlene Ballsiara Like  Role: patient   Name: Vernard Gamblesarolyn Etha Stambaugh  Role: PMHNP  Name: Oletta Cohnaneka Lewis  Role: NP  Name:  Role:    Ardis Hughsarolyn H Janeah Kovacich, NP 12/31/2020 11:51 AM

## 2020-12-31 NOTE — Progress Notes (Signed)
Patient refuses ABG. RN and Physician aware.

## 2021-01-01 ENCOUNTER — Other Ambulatory Visit: Payer: Self-pay

## 2021-01-01 ENCOUNTER — Emergency Department (HOSPITAL_COMMUNITY)
Admission: EM | Admit: 2021-01-01 | Discharge: 2021-01-01 | Disposition: A | Discharge status: Home / Self Care | Payer: Medicaid Other | Attending: Emergency Medicine | Admitting: Emergency Medicine

## 2021-01-01 ENCOUNTER — Encounter (HOSPITAL_COMMUNITY): Payer: Self-pay

## 2021-01-01 DIAGNOSIS — R059 Cough, unspecified: Secondary | ICD-10-CM | POA: Insufficient documentation

## 2021-01-01 DIAGNOSIS — Z5900 Homelessness unspecified: Secondary | ICD-10-CM | POA: Insufficient documentation

## 2021-01-01 DIAGNOSIS — Z20822 Contact with and (suspected) exposure to covid-19: Secondary | ICD-10-CM | POA: Insufficient documentation

## 2021-01-01 DIAGNOSIS — J00 Acute nasopharyngitis [common cold]: Secondary | ICD-10-CM | POA: Insufficient documentation

## 2021-01-01 DIAGNOSIS — F4321 Adjustment disorder with depressed mood: Secondary | ICD-10-CM

## 2021-01-01 DIAGNOSIS — M791 Myalgia, unspecified site: Secondary | ICD-10-CM

## 2021-01-01 DIAGNOSIS — R519 Headache, unspecified: Secondary | ICD-10-CM | POA: Insufficient documentation

## 2021-01-01 DIAGNOSIS — F332 Major depressive disorder, recurrent severe without psychotic features: Secondary | ICD-10-CM | POA: Insufficient documentation

## 2021-01-01 DIAGNOSIS — D649 Anemia, unspecified: Secondary | ICD-10-CM

## 2021-01-01 DIAGNOSIS — D509 Iron deficiency anemia, unspecified: Secondary | ICD-10-CM | POA: Insufficient documentation

## 2021-01-01 LAB — COMPREHENSIVE METABOLIC PANEL
ALT: 15 U/L (ref 0–44)
AST: 23 U/L (ref 15–41)
Albumin: 3.7 g/dL (ref 3.5–5.0)
Alkaline Phosphatase: 62 U/L (ref 38–126)
Anion gap: 7 (ref 5–15)
BUN: 9 mg/dL (ref 6–20)
CO2: 22 mmol/L (ref 22–32)
Calcium: 9.1 mg/dL (ref 8.9–10.3)
Chloride: 114 mmol/L — ABNORMAL HIGH (ref 98–111)
Creatinine, Ser: 0.63 mg/dL (ref 0.44–1.00)
GFR, Estimated: >60 mL/min (ref >60)
Glucose, Bld: 99 mg/dL (ref 70–99)
Potassium: 3.8 mmol/L (ref 3.5–5.1)
Sodium: 143 mmol/L (ref 135–145)
Total Bilirubin: 0.8 mg/dL (ref 0.3–1.2)
Total Protein: 7 g/dL (ref 6.5–8.1)

## 2021-01-01 LAB — CBC WITH DIFFERENTIAL/PLATELET
Abs Immature Granulocytes: 0.01 10*3/uL (ref 0.00–0.07)
Basophils Absolute: 0 10*3/uL (ref 0.0–0.1)
Basophils Relative: 1 %
Eosinophils Absolute: 0.2 10*3/uL (ref 0.0–0.5)
Eosinophils Relative: 3 %
HCT: 33.1 % — ABNORMAL LOW (ref 36.0–46.0)
Hemoglobin: 10.3 g/dL — ABNORMAL LOW (ref 12.0–15.0)
Immature Granulocytes: 0 %
Lymphocytes Relative: 44 %
Lymphs Abs: 2.4 10*3/uL (ref 0.7–4.0)
MCH: 25.4 pg — ABNORMAL LOW (ref 26.0–34.0)
MCHC: 31.1 g/dL (ref 30.0–36.0)
MCV: 81.7 fL (ref 80.0–100.0)
Monocytes Absolute: 0.4 10*3/uL (ref 0.1–1.0)
Monocytes Relative: 7 %
Neutro Abs: 2.5 10*3/uL (ref 1.7–7.7)
Neutrophils Relative %: 45 %
Platelets: 266 10*3/uL (ref 150–400)
RBC: 4.05 MIL/uL (ref 3.87–5.11)
RDW: 15.4 % (ref 11.5–15.5)
WBC: 5.5 10*3/uL (ref 4.0–10.5)
nRBC: 0 % (ref 0.0–0.2)

## 2021-01-01 LAB — SARS CORONAVIRUS 2 (TAT 6-24 HRS): SARS Coronavirus 2: NEGATIVE

## 2021-01-01 LAB — URINALYSIS, ROUTINE W REFLEX MICROSCOPIC
Bilirubin Urine: NEGATIVE
Glucose, UA: NEGATIVE mg/dL
Ketones, ur: 5 mg/dL — AB
Nitrite: NEGATIVE
Protein, ur: 100 mg/dL — AB
RBC / HPF: >50 RBC/hpf — ABNORMAL HIGH (ref 0–5)
Specific Gravity, Urine: 1.03 (ref 1.005–1.030)
pH: 5 (ref 5.0–8.0)

## 2021-01-01 LAB — CK: Total CK: 327 U/L — ABNORMAL HIGH (ref 38–234)

## 2021-01-01 MED ORDER — ACETAMINOPHEN 325 MG PO TABS: 650.0000 mg | ORAL_TABLET | ORAL | Status: DC | PRN

## 2021-01-01 MED ORDER — ONDANSETRON HCL 4 MG PO TABS: 4.0000 mg | ORAL_TABLET | Freq: Three times a day (TID) | ORAL | Status: DC | PRN

## 2021-01-01 MED ORDER — LACTATED RINGERS IV BOLUS
1000.0000 mL | Freq: Once | INTRAVENOUS | Status: AC
  Administered 2021-01-01: 1000 mL via INTRAVENOUS

## 2021-01-01 MED ORDER — PROCHLORPERAZINE EDISYLATE 10 MG/2ML IJ SOLN
10.0000 mg | Freq: Once | INTRAMUSCULAR | Status: AC
  Administered 2021-01-01: 10 mg via INTRAVENOUS
  Filled 2021-01-01: qty 2

## 2021-01-01 MED ORDER — ACETAMINOPHEN 325 MG PO TABS
650.0000 mg | ORAL_TABLET | Freq: Once | ORAL | Status: AC
  Administered 2021-01-01: 650 mg via ORAL
  Filled 2021-01-01: qty 2

## 2021-01-01 MED ORDER — KETOROLAC TROMETHAMINE 30 MG/ML IJ SOLN
30.0000 mg | Freq: Once | INTRAMUSCULAR | Status: AC
  Administered 2021-01-01: 30 mg via INTRAVENOUS
  Filled 2021-01-01: qty 1

## 2021-01-01 MED ORDER — ALUM & MAG HYDROXIDE-SIMETH 200-200-20 MG/5ML PO SUSP: 30.0000 mL | Freq: Four times a day (QID) | ORAL | Status: DC | PRN

## 2021-01-01 NOTE — Progress Notes (Signed)
01/01/2021 @ 3:00pm  TOC CSW/CM spoke with pt.  Pt completed an assessment with Control and instrumentation engineer.  [facility] accepted pt.  Pt signed [employer] Waiver.  CSW contacted [employer] for pt.  Driver's Information Name:  Joselyn Glassman  Car:  Black-[employer]/Civic  Tag: [id number]  Jakyah Bradby Tarpley-Carter, MSW, LCSW-A Pronouns:  She, Her, Hers                  [facility] Transitions of CareClinical Social Worker [email or web address] [phone]

## 2021-01-01 NOTE — ED Provider Notes (Signed)
[facility]-EMERGENCY DEPT Provider Note   CSN: [id number] Arrival date & time: 01/01/21  0116   History Chief Complaint  Patient presents with  . Generalized Body Aches    Madison Taylor is a 35 y.o. female.  The history is provided by the patient.  She has history of depression and comes in with multiple complaints.  She states that she is felt cold with body aches and headache since yesterday morning.  She was not aware of any fever.  There has been no nausea, vomiting, diarrhea.  She has had a mild cough which is nonproductive.  She denies suicidal ideation.  She was in the emergency department yesterday and had spoken to someone from TTS, but she states that she was so groggy that she is not sure that she was able to explain herself well.  She also is requesting to talk with a Child psychotherapist because she was told that she talk with the social worker but does not remember that.  She is currently homeless.  History reviewed. No pertinent past medical history.  Patient Active Problem List   Diagnosis Date Noted  . Adjustment disorder with depressed mood 12/31/2020    History reviewed. No pertinent surgical history.   OB History   No obstetric history on file.     No family history on file.  Social History   Tobacco Use  . Smoking status: Never Smoker  . Smokeless tobacco: Never Used  Substance Use Topics  . Alcohol use: Never  . Drug use: Never    Home Medications Prior to Admission medications   Not on File    Allergies    Patient has no known allergies.  Review of Systems   Review of Systems  All other systems reviewed and are negative.   Physical Exam Updated Vital Signs BP (!) 159/101 (BP Location: Right Arm)   Pulse 93   Temp 98.5 F (36.9 C) (Oral)   Resp 16   Ht 5\' 6"  (1.676 m)   Wt 102.1 kg   SpO2 100%   BMI 36.32 kg/m   Physical Exam Vitals and nursing note reviewed.   35 year old female, resting comfortably and in no  acute distress. Vital signs are significant for elevated blood pressure. Oxygen saturation is 100%, which is normal. Head is normocephalic and atraumatic. PERRLA, EOMI. Oropharynx is clear. Neck is nontender and supple without adenopathy or JVD. Back is nontender and there is no CVA tenderness. Lungs are clear without rales, wheezes, or rhonchi. Chest is nontender. Heart has regular rate and rhythm without murmur. Abdomen is soft, flat, nontender without masses or hepatosplenomegaly and peristalsis is normoactive. Extremities have trace edema, full range of motion is present. Skin is warm and dry without rash. Neurologic: Mental status is normal, cranial nerves are intact, there are no motor or sensory deficits.  ED Results / Procedures / Treatments   Labs (all labs ordered are listed, but only abnormal results are displayed) Labs Reviewed  COMPREHENSIVE METABOLIC PANEL - Abnormal; Notable for the following components:      Result Value   Chloride 114 (*)    All other components within normal limits  CBC WITH DIFFERENTIAL/PLATELET - Abnormal; Notable for the following components:   Hemoglobin 10.3 (*)    HCT 33.1 (*)    MCH 25.4 (*)    All other components within normal limits  URINALYSIS, ROUTINE W REFLEX MICROSCOPIC - Abnormal; Notable for the following components:   Color, Urine AMBER (*)  APPearance HAZY (*)    Hgb urine dipstick LARGE (*)    Ketones, ur 5 (*)    Protein, ur 100 (*)    Leukocytes,Ua TRACE (*)    RBC / HPF >50 (*)    Bacteria, UA RARE (*)    All other components within normal limits  CK - Abnormal; Notable for the following components:   Total CK 327 (*)    All other components within normal limits   Procedures Procedures   Medications Ordered in ED Medications  lactated ringers bolus 1,000 mL (has no administration in time range)  acetaminophen (TYLENOL) tablet 650 mg (has no administration in time range)  ketorolac (TORADOL) 30 MG/ML injection 30  mg (has no administration in time range)  prochlorperazine (COMPAZINE) injection 10 mg (has no administration in time range)    ED Course  I have reviewed the triage vital signs and the nursing notes.  Pertinent labs & imaging results that were available during my care of the patient were reviewed by me and considered in my medical decision making (see chart for details).  MDM Rules/Calculators/A&P Multiple complaints, most of which probably are centering around her situation is homeless.  Old records are reviewed, and her ED visit yesterday actually started off as altered mental status.  She did have an elevated CK.  We will repeat screening labs and she will be given a headache cocktail of lactated Ringer's, prochlorperazine and will also give ketorolac and acetaminophen.  She is requesting the opportunity to talk with TTS again and TTS is consulted as is transition of care team.  Of note, she did have negative respiratory pathogen screen yesterday.  Following above-noted treatment, patient is no longer complaining of any pain but she still states she is feeling cold.  Urinalysis shows no evidence of infection, WBC is normal.  CK is still elevated, but has decreased significantly compared with 2 days ago.  Anemia is present which is slightly worse, possibly related to hydration.  TTS consultation is pending.  Case is signed out to Dr. Lynelle Doctor.  Final Clinical Impression(s) / ED Diagnoses Final diagnoses:  Myalgia  Homeless  Normochromic normocytic anemia    Rx / DC Orders ED Discharge Orders    None       Dione Booze, MD 01/01/21 [phone]

## 2021-01-01 NOTE — ED Notes (Signed)
BREAKFAST TRAY GIVEN 

## 2021-01-01 NOTE — ED Notes (Signed)
SW Alaina setting patient up with homeless shelter, as per Alaina pt ok to wait in waiting room.

## 2021-01-01 NOTE — Progress Notes (Signed)
TOC CSW consulted with pt about her needs.  Pt would like transportation to a family members home in [place], [place] (where pt is originally from).  Pt is in hopes that her family member will allow her the opportunity to stay with them until she could gain employment and her own apartment.  CSW will assist pt with getting transportation to [address], [place], [place].  Semiah Konczal Tarpley-Carter, MSW, LCSW-A Pronouns:  She, Her, Hers                  [facility] Transitions of CareClinical Social Worker [email or web address] [phone]

## 2021-01-01 NOTE — ED Provider Notes (Signed)
MSE was initiated and I personally evaluated the patient and placed orders (if any) at  2:23 AM on January 01, 2021.  Patient presents to the ED with complaints of headache & body aches for the past few days. Denies injury or change in activity.   Alert. Clear speech. Moving all extremities.   Visit yesterday- per prior provider note found to have a metabolic acidosis- received fluids for this. Patient declining repeat labs at this time.    The patient appears stable so that the remainder of the MSE may be completed by another provider.   [name] 01/01/21 0226    Dione Booze, MD 01/01/21 [phone]

## 2021-01-01 NOTE — ED Provider Notes (Signed)
Pt seen by Dr Preston Fleeting.  Medically cleared.  TTS consult pending.   Linwood Dibbles, MD 01/01/21 [phone]

## 2021-01-01 NOTE — BH Assessment (Signed)
Comprehensive Clinical Assessment (CCA) Note  01/01/2021 Madison Taylor [id number]   Disposition: Per Dr. Lucianne Muss patient is psych cleared. TOC consult placed. [facility] ED notified  The patient demonstrates the following risk factors for suicide: Chronic risk factors for suicide include: psychiatric disorder of depression. Acute risk factors for suicide include: N/A. Protective factors for this patient include: coping skills and hope for the future. Considering these factors, the overall suicide risk at this point appears to be low. Patient is appropriate for outpatient follow up.  Flowsheet Row ED from 01/01/2021 in [facility] from 12/30/2020 in [facility]  C-SSRS RISK CATEGORY No Risk No Risk     Therefore, patient is not a risk for suicide. No suicide sitter precautions recommended.  Patient is a 35 year old female presenting voluntarily to [facility] ED with a chief complaint of generalized body aches and requests to speak with psychiatry and social work. Patient was assessed by [facility] on 4/4. Per that assessment:  "Pt did not initially answer questions and required multiple re-directions to interact. When asked why she was in the hospital, she stated, "I'm dehydrated." When asked what we can do to help, pt stated, "just give me some medicine to see if that works. I've had pains all day. I was given ibuprofen." When asked if the medicine helped, she stated, "last night I slept so I don't know if it did but I just got up a few minutes ago and I still hurt. Pt denies SI or a hx of SI. She denies HI, AVH, NSSIB, access to guns/weapons, engagement with the legal system, or SA. As of now, pt has not provided a urine sample for a UDA."  On assessment today patient is calm and cooperative. She denies SI/HI/AVH. She states she is looking for hospitalization to help with coping skills for her depression and communication issues. She states she does not have  any current out patient resources. She states that she has been hospitalized in the past but "did not take it seriously." Patient is also seeking homeless resources.    Chief Complaint:  Chief Complaint  Patient presents with  . Generalized Body Aches   Visit Diagnosis: F33.2 MDD, recurrent, severe  CCA Biopsychosocial Intake/Chief Complaint:  NA  Current Symptoms/Problems: NA   Patient Reported Schizophrenia/Schizoaffective Diagnosis in Past: No   Strengths: NA  Preferences: NA  Abilities: NA   Type of Services Patient Feels are Needed: NA   Initial Clinical Notes/Concerns: N/A   Mental Health Symptoms Depression:  Change in energy/activity; Hopelessness; Increase/decrease in appetite; Tearfulness; Worthlessness   Duration of Depressive symptoms: Greater than two weeks   Mania:  None   Anxiety:   None   Psychosis:  None   Duration of Psychotic symptoms: Less than six months   Trauma:  None   Obsessions:  None   Compulsions:  None   Inattention:  None   Hyperactivity/Impulsivity:  N/A   Oppositional/Defiant Behaviors:  None   Emotional Irregularity:  Potentially harmful impulsivity   Other Mood/Personality Symptoms:  None noted    Mental Status Exam Appearance and self-care  Stature:  Average   Weight:  Average weight   Clothing:  -- (Pt was dressed in scrubs)   Grooming:  Normal   Cosmetic use:  None   Posture/gait:  Normal   Motor activity:  Not Remarkable   Sensorium  Attention:  Inattentive   Concentration:  Variable   Orientation:  X5 (UTA)  Recall/memory:  Normal (Pt was slow to answer questions/provide information)   Affect and Mood  Affect:  Appropriate   Mood:  Other (Comment) (pleasant)   Relating  Eye contact:  Normal   Facial expression:  Responsive   Attitude toward examiner:  Cooperative   Thought and Language  Speech flow: Clear and Coherent   Thought content:  Appropriate to Mood and Circumstances    Preoccupation:  None   Hallucinations:  None   Organization:  No data recorded  [employer] of Knowledge:  Average   Intelligence:  Average   Abstraction:  Normal (UTA)   Judgement:  Fair   Reality Testing:  Realistic (UTA)   Insight:  Lacking   Decision Making:  Impulsive   Social Functioning  Social Maturity:  Impulsive   Social Judgement:  Heedless   Stress  Stressors:  Housing (UTA)   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)   Skill Deficits:  Communication; Self-care   Supports:  Support needed Industrial/product designer)     Religion: Religion/Spirituality Are You A Religious Person?: No How Might This Affect Treatment?: Not assessed  Leisure/Recreation: Leisure / Recreation Do You Have Hobbies?: No  Exercise/Diet: Exercise/Diet Do You Exercise?: No Have You Gained or Lost A Significant Amount of Weight in the Past Six Months?: No Do You Follow a Special Diet?: No Do You Have Any Trouble Sleeping?: No   CCA Employment/Education Employment/Work Situation: Employment / Work Psychologist, occupational Employment situation: Unemployed (Not assessed) Patient's job has been impacted by current illness: No 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 Where was the patient employed at that time?: [employer] Has patient ever been in the [employer]?: No (Not assessed)  Education: Education Is Patient Currently Attending School?: No Last Grade Completed:  (Not assessed) Name of High School: Not assessed Did 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[employer]?: Yes Did Theme park manager?: No (Not assessed) Did You Attend Graduate School?: No Did You Have Any Special Interests In School?: Not assessed Did You Have An Individualized Education Program (IIEP): No (Not assessed) Did You Have Any Difficulty At School?: No (Not assessed) Patient's Education Has Been Impacted by Current Illness: No   CCA Family/Childhood History Family and Relationship History: Family history Marital status:   (Not assessed) Are you sexually active?: No What is your sexual orientation?: heterosexual Has your sexual activity been affected by drugs, alcohol, medication, or emotional stress?: NA Does patient have children?: No  Childhood History:  Childhood History By whom was/is the patient raised?: Mother (Not assessed) Additional childhood history information: Not assessed Description of patient's relationship with caregiver when they were a child: Not assessed Patient's description of current relationship with people who raised him/her: Not assessed How were you disciplined when you got in trouble as a child/adolescent?: Not assessed Does patient have siblings?: Yes Description of patient's current relationship with siblings: no relationship Did patient suffer any verbal/emotional/physical/sexual abuse as a child?: No Did patient suffer from severe childhood neglect?: No Has patient ever been sexually abused/assaulted/raped as an adolescent or adult?: No Was the patient ever a victim of a crime or a disaster?: No Witnessed domestic violence?: No Has patient been affected by domestic violence as an adult?: No  Child/Adolescent Assessment:     CCA Substance Use Alcohol/Drug Use: Alcohol / Drug Use Pain Medications: Please see MAR Prescriptions: Please see MAR Over the Counter: Please see MAR History of alcohol / drug use?: No history of alcohol / drug abuse (UTA -  pt denies SA but has not yet provided a urine sample) Negative Consequences of Use:  (Unknown) Withdrawal Symptoms:  (Unknown)                         ASAM's:  Six Dimensions of Multidimensional Assessment  Dimension 1:  Acute Intoxication and/or Withdrawal Potential:      Dimension 2:  Biomedical Conditions and Complications:      Dimension 3:  Emotional, Behavioral, or Cognitive Conditions and Complications:     Dimension 4:  Readiness to Change:     Dimension 5:  Relapse, Continued use, or Continued  Problem Potential:     Dimension 6:  Recovery/Living Environment:     ASAM Severity Score:    ASAM Recommended Level of Treatment: ASAM Recommended Level of Treatment:  (Unknown)   Substance use Disorder (SUD) Substance Use Disorder (SUD)  Checklist Symptoms of Substance Use:  (Unknown)  Recommendations for Services/Supports/Treatments: Recommendations for Services/Supports/Treatments Recommendations For Services/Supports/Treatments: Other (Comment) (Observation at [facility])  DSM5 Diagnoses: Patient Active Problem List   Diagnosis Date Noted  . Adjustment disorder with depressed mood 12/31/2020    Patient Centered Plan: Patient is on the following Treatment Plan(s):    Referrals to Alternative Service(s): Referred to Alternative Service(s):   Place:   Date:   Time:    Referred to Alternative Service(s):   Place:   Date:   Time:    Referred to Alternative Service(s):   Place:   Date:   Time:    Referred to Alternative Service(s):   Place:   Date:   Time:     Celedonio Miyamoto, LCSW

## 2021-01-01 NOTE — Progress Notes (Signed)
..   Transition of Care [facility]) - Emergency Department Mini Assessment   Patient Details  Name: Madison Taylor MRN: [id number] Date of Birth: [birth year]  Transition of Care [facility]) CM/SW Contact:    Elliot Cousin, RN Phone Number: [phone] 01/01/2021, 5:17 PM   Clinical Narrative: 330 pm TOC CM contact [employer], Ms Wilson Singer attempted to complete the assessment with patient on the phone. Pt was not forthcoming with answers that satisfied intake admission process. They were unable to accept for housing tonight or Face to Face Intake tomorrow for their long term shelter program. Provided pt with 2 PART bus passes and she had her bus pass from yesterday. Pt was not able to provide any contacts from her phone of family of friends that the [facility] CM could call to assist her. Stated her phone was currently off because she did not pay bill. Isidoro Donning RN CCM, [facility] ED TOC CM [phone]  1230 pm TOC CM spoke to pt and she is agreeable to go to [employer] to house and receive assistance through their program for job placement, permanent housing and shelter. Pt will have to complete assessment. Waiting on [facility], Tomma Lightning to give a call back.    1200 pm TOC CM contacted [employer] and left message for return call.   ED Mini Assessment: What brought you to the Emergency Department? : pain  Barriers to Discharge: No Barriers Identified  Barrier interventions: contacted [facility], provide bus pass and PART pass  Means of departure: Public Transportation  Interventions which prevented an admission or readmission: Transportation Screening,Homeless Screening    Patient Contact and Communications       Contact Date: 01/01/21,          Patient states their goals for this hospitalization and ongoing recovery are:: Pt would like transportation to [place], [place].   Choice offered to / list presented to : NA  Admission  diagnosis:  Generalized Body Aches Patient Active Problem List   Diagnosis Date Noted  . Adjustment disorder with depressed mood 12/31/2020   PCP:  Pcp, No Pharmacy:   [id number] - [place], [place] - [address] AT [facility] OF [address] [place] [address] Phone: [phone] Fax: [phone]

## 2021-01-01 NOTE — ED Notes (Signed)
Pt provided discharge papers awaiting SW to arrange ride.

## 2021-01-01 NOTE — BHH Counselor (Addendum)
TTS assessment complete. Per Dr. Lucianne Muss patient does not meet in patient care criteria. She recommends consult to TOC.  @1257 : This counselor confirmed with , DNP patient is psych cleared for discharge. Patient's RN notified via secure chat.

## 2021-01-01 NOTE — ED Triage Notes (Signed)
Pt reports generalized body aches for several days. Sts she was found on the side of the road yesterday because she was tired and lethargic.

## 2021-01-02 ENCOUNTER — Other Ambulatory Visit: Payer: Self-pay

## 2021-01-02 ENCOUNTER — Emergency Department (HOSPITAL_COMMUNITY)
Admission: EM | Admit: 2021-01-02 | Discharge: 2021-01-02 | Disposition: A | Discharge status: Home / Self Care | Payer: Self-pay | Attending: Emergency Medicine | Admitting: Emergency Medicine

## 2021-01-02 ENCOUNTER — Encounter (HOSPITAL_COMMUNITY): Payer: Self-pay

## 2021-01-02 DIAGNOSIS — M79604 Pain in right leg: Secondary | ICD-10-CM

## 2021-01-02 DIAGNOSIS — M79661 Pain in right lower leg: Secondary | ICD-10-CM | POA: Insufficient documentation

## 2021-01-02 DIAGNOSIS — M79662 Pain in left lower leg: Secondary | ICD-10-CM | POA: Insufficient documentation

## 2021-01-02 LAB — COMPREHENSIVE METABOLIC PANEL
ALT: 15 U/L (ref 0–44)
AST: 23 U/L (ref 15–41)
Albumin: 3.8 g/dL (ref 3.5–5.0)
Alkaline Phosphatase: 54 U/L (ref 38–126)
Anion gap: 9 (ref 5–15)
BUN: 5 mg/dL — ABNORMAL LOW (ref 6–20)
CO2: 22 mmol/L (ref 22–32)
Calcium: 8.8 mg/dL — ABNORMAL LOW (ref 8.9–10.3)
Chloride: 111 mmol/L (ref 98–111)
Creatinine, Ser: 0.51 mg/dL (ref 0.44–1.00)
GFR, Estimated: >60 mL/min (ref >60)
Glucose, Bld: 91 mg/dL (ref 70–99)
Potassium: 3.3 mmol/L — ABNORMAL LOW (ref 3.5–5.1)
Sodium: 142 mmol/L (ref 135–145)
Total Bilirubin: 0.8 mg/dL (ref 0.3–1.2)
Total Protein: 6.8 g/dL (ref 6.5–8.1)

## 2021-01-02 LAB — CK: Total CK: 454 U/L — ABNORMAL HIGH (ref 38–234)

## 2021-01-02 MED ORDER — POTASSIUM CHLORIDE CRYS ER 20 MEQ PO TBCR
40.0000 meq | EXTENDED_RELEASE_TABLET | Freq: Once | ORAL | Status: AC
  Administered 2021-01-02: 40 meq via ORAL
  Filled 2021-01-02: qty 2

## 2021-01-02 MED ORDER — POTASSIUM CHLORIDE CRYS ER 20 MEQ PO TBCR: 20.0000 meq | EXTENDED_RELEASE_TABLET | Freq: Every day | ORAL | 0 refills | Status: AC

## 2021-01-02 MED ORDER — METHOCARBAMOL 500 MG PO TABS: 500.0000 mg | ORAL_TABLET | Freq: Once | ORAL | Status: DC

## 2021-01-02 MED ORDER — NAPROXEN 500 MG PO TABS: 500.0000 mg | ORAL_TABLET | Freq: Two times a day (BID) | ORAL | 0 refills | Status: AC | PRN

## 2021-01-02 MED ORDER — SODIUM CHLORIDE 0.9 % IV BOLUS
2000.0000 mL | Freq: Once | INTRAVENOUS | Status: AC
  Administered 2021-01-02: 2000 mL via INTRAVENOUS

## 2021-01-02 MED ORDER — NAPROXEN 500 MG PO TABS
500.0000 mg | ORAL_TABLET | Freq: Once | ORAL | Status: AC
  Administered 2021-01-02: 500 mg via ORAL
  Filled 2021-01-02: qty 1

## 2021-01-02 MED ORDER — ACETAMINOPHEN 500 MG PO TABS
1000.0000 mg | ORAL_TABLET | Freq: Once | ORAL | Status: AC
  Administered 2021-01-02: 1000 mg via ORAL
  Filled 2021-01-02: qty 2

## 2021-01-02 NOTE — ED Triage Notes (Signed)
Pt reports bilateral leg pain and swelling x 3 days.

## 2021-01-02 NOTE — Discharge Instructions (Addendum)
You were seen in the emergency department today for leg pain.  Your CK which looks at your muscles is mildly elevated, you were given fluids for this.  It is extremely important that you stay well-hydrated drinking plenty of electrolyte-containing solutions.  Your potassium was a bit low, we are sending home with a supplement for the next 3 days, please see attached guidelines as well.  Please have your primary care provider recheck these labs within 1 week.  We are also sending you with naproxen to help with pain. - Naproxen is a nonsteroidal anti-inflammatory medication that will help with pain and swelling. Be sure to take this medication as prescribed with food, 1 pill every 12 hours,  It should be taken with food, as it can cause stomach upset, and more seriously, stomach bleeding. Do not take other nonsteroidal anti-inflammatory medications with this such as Advil, Motrin, Aleve, Mobic, Goodie Powder, or Motrin.     You make take Tylenol per over the counter dosing with these medications.   We have prescribed you new medication(s) today. Discuss the medications prescribed today with your pharmacist as they can have adverse effects and interactions with your other medicines including over the counter and prescribed medications. Seek medical evaluation if you start to experience new or abnormal symptoms after taking one of these medicines, seek care immediately if you start to experience difficulty breathing, feeling of your throat closing, facial swelling, or rash as these could be indications of a more serious allergic reaction  Please follow-up with primary care within 3 days.  Return to the emergency department for new or worsening symptoms including but not limited to new or worsening pain, fever, increased leg swelling, chest pain, trouble breathing, passing out, discoloration of your legs, new rashes, weakness, numbness, or any other concerns.   Please have your blood pressure rechecked at  your follow-up appointment as well as it was elevated in the ER today.

## 2021-01-02 NOTE — ED Provider Notes (Signed)
[facility] Provider Note   CSN: [id number] Arrival date & time: 01/02/21  0026     History Chief Complaint  Patient presents with  . Leg Pain    Madison Taylor is a 35 y.o. female with a hx of adjustment disorder who returns to the ED with complaints of leg pain x 2-3 days. Patient states she has had problems with generalized body aches for the past few days, seen in the ED for this the last 2 days, majority has resolved but remains with mild bilateral lower extremity discomfort.  She states that overall the pain in her legs has improved, she is worried that they might be a bit swollen, whenever she is here in the emergency department the medicines that are given have helped her, however then the pain returns after discharge.  She has not taken any medications prior to arrival tonight.  She denies any injury or change in activity, she denies any numbness, tingling, weakness, open wounds, chest pain, trouble breathing, or syncope.  HPI     History reviewed. No pertinent past medical history.  Patient Active Problem List   Diagnosis Date Noted  . Adjustment disorder with depressed mood 12/31/2020    History reviewed. No pertinent surgical history.   OB History   No obstetric history on file.     No family history on file.  Social History   Tobacco Use  . Smoking status: Never Smoker  . Smokeless tobacco: Never Used  Substance Use Topics  . Alcohol use: Never  . Drug use: Never    Home Medications Prior to Admission medications   Not on File    Allergies    Patient has no known allergies.  Review of Systems   Review of Systems  Constitutional: Negative for chills and fever.  Respiratory: Negative for shortness of breath.   Cardiovascular: Positive for leg swelling. Negative for chest pain.  Gastrointestinal: Negative for abdominal pain.  Genitourinary:       Negative for dark urine.   Musculoskeletal: Positive for myalgias.   Neurological: Negative for syncope.  All other systems reviewed and are negative.   Physical Exam Updated Vital Signs BP (!) 167/110 (BP Location: Left Arm)   Pulse (!) 106   Temp 98.4 F (36.9 C) (Oral)   Resp 18   Ht 5\' 6"  (1.676 m)   Wt 102.1 kg   SpO2 100%   BMI 36.32 kg/m   Physical Exam Vitals and nursing note reviewed.  Constitutional:      General: She is not in acute distress.    Appearance: She is well-developed. She is not ill-appearing or toxic-appearing.  HENT:     Head: Normocephalic and atraumatic.  Eyes:     General:        Right eye: No discharge.        Left eye: No discharge.     Conjunctiva/sclera: Conjunctivae normal.  Cardiovascular:     Rate and Rhythm: Normal rate and regular rhythm.     Pulses:          Dorsalis pedis pulses are 2+ on the right side and 2+ on the left side.       Posterior tibial pulses are 2+ on the right side and 2+ on the left side.  Pulmonary:     Effort: Pulmonary effort is normal. No respiratory distress.     Breath sounds: Normal breath sounds. No wheezing, rhonchi or rales.  Abdominal:  General: There is no distension.     Palpations: Abdomen is soft.     Tenderness: There is no abdominal tenderness.  Musculoskeletal:     Cervical back: Neck supple.     Comments: Lower extremities: No obvious deformity, appreciable swelling, edema, erythema, ecchymosis, warmth, or open wounds. Patient has intact AROM to bilateral hips, knees, ankles, and all digits.  She has no focal bony tenderness to palpation.  No significant muscular tenderness.  Compartments are soft.  Skin:    General: Skin is warm and dry.     Capillary Refill: Capillary refill takes less than 2 seconds.     Findings: No rash.  Neurological:     Mental Status: She is alert.     Comments: Alert. Clear speech. Sensation grossly intact to bilateral lower extremities. 5/5 strength with plantar/dorsiflexion, knee flexion/extension, and hip flexion/extension  bilaterally. Patient ambulatory.  Psychiatric:        Mood and Affect: Mood normal.        Behavior: Behavior normal.     ED Results / Procedures / Treatments   Labs (all labs ordered are listed, but only abnormal results are displayed) Labs Reviewed  COMPREHENSIVE METABOLIC PANEL - Abnormal; Notable for the following components:      Result Value   Potassium 3.3 (*)    BUN 5 (*)    Calcium 8.8 (*)    All other components within normal limits  CK - Abnormal; Notable for the following components:   Total CK 454 (*)    All other components within normal limits    EKG None  Radiology No results found.  Procedures Procedures   Medications Ordered in ED Medications - No data to display  ED Course  I have reviewed the triage vital signs and the nursing notes.  Pertinent labs & imaging results that were available during my care of the patient were reviewed by me and considered in my medical decision making (see chart for details).    MDM Rules/Calculators/A&P                         Patient presents to the ED with complaints of lower extremity pain over the past few days.  Nontoxic, resting comfortably, initial tachycardia resolved on my exam, BP elevated, doubt hypertensive emergency.  Additional history obtained:  Additional history obtained from chart review & nursing note review.  Seen in the ED yesterday & day prior.  Last visit improved CK and metabolic panel, medically cleared, TTS/TOC consulted. Patient was psych cleared. [facility] accepted the patient and was taken by safe transport.   Lab Tests:  I Ordered, reviewed, and interpreted labs, which included:  CK: Remains elevated, somewhat more from yesterday but better than initial. CMP: Mild hypokalemia.  ED Course:  Patient given 2 L of fluids for mild elevation in CK, do not feel that she has significant acute rhabdomyolysis require admission at this time.  She has no signs of infection.  No pitting  edema, no calf tenderness, bilateral, do not suspect DVT.  Her lower extremities are well-perfused.  She has no recent trauma or focal bony tenderness to raise concern for fracture or dislocation.  No pitting edema or dyspnea, lungs are clear to auscultation, do not suspect acute CHF.  Neurovascularly intact distally.  Given medications in the emergency department with some improvement.  Will discharge with further analgesics, instructions for good hydration, and PCP Follow up. I discussed results, treatment plan,  need for follow-up, and return precautions with the patient. Provided opportunity for questions, patient confirmed understanding and is in agreement with plan.   Findings and plan of care discussed with supervising physician Dr. Wilkie Aye who is in agreement.   Portions of this note were generated with Scientist, clinical (histocompatibility and immunogenetics). Dictation errors may occur despite best attempts at proofreading.  Final Clinical Impression(s) / ED Diagnoses Final diagnoses:  Pain in both lower extremities    Rx / DC Orders ED Discharge Orders         Ordered    naproxen (NAPROSYN) 500 MG tablet  2 times daily PRN        01/02/21 0630    potassium chloride SA (KLOR-CON) 20 MEQ tablet  Daily        01/02/21 [address] Pleas Koch, PA-C 01/02/21 0631    Shon Baton, MD 01/06/21 2315

## 2022-11-07 IMAGING — CT CT HEAD W/O CM
3 of 4 series · 14 of 47 positions shown, 16 images · non-contrast
Comparison: None.

CLINICAL DATA: Altered mental status.

EXAM:
CT HEAD WITHOUT CONTRAST
TECHNIQUE: Contiguous axial images were obtained from the base of the skull
through the vertex without intravenous contrast.

[Series 5: coronal soft tissue · coronal · 0.31mm/px · 3 of 68 slices shown]
[im 23/68  brain]
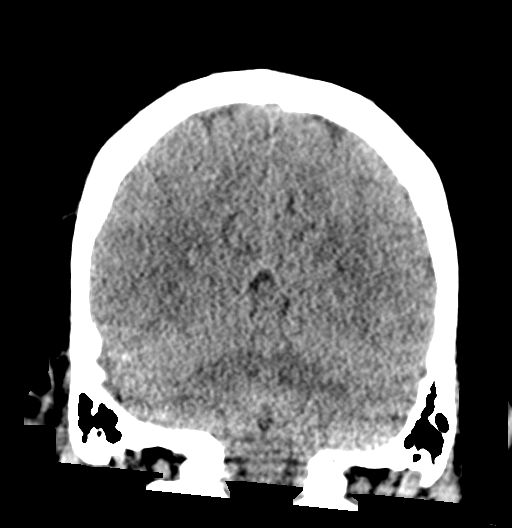
[im 30/68  brain]
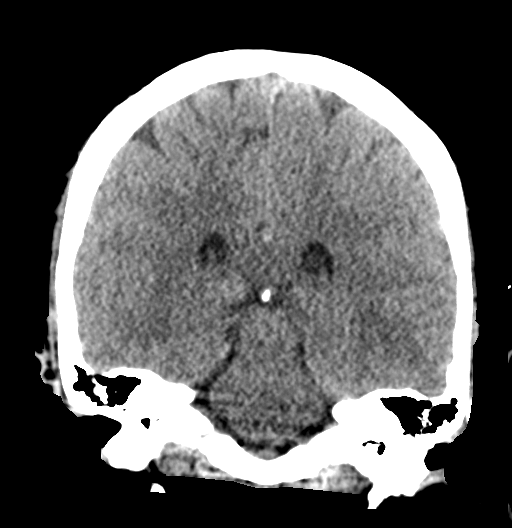
[im 38/68  brain]
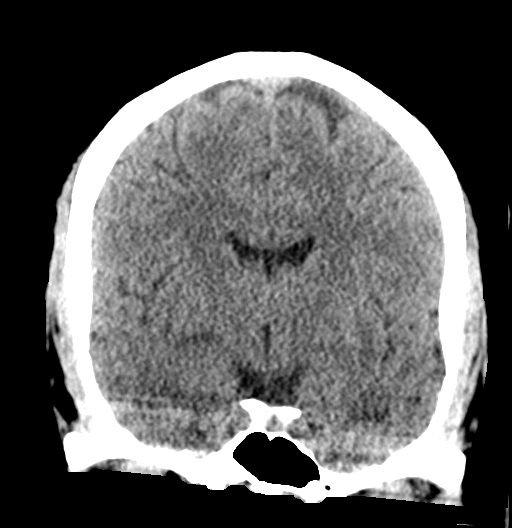

[Series 6: sagittal soft tissue · sagittal · 0.33mm/px · 3 of 57 slices shown]
[im 19/57  brain]
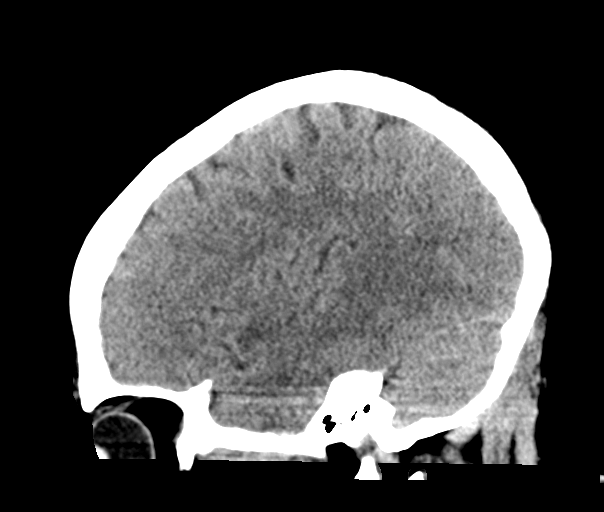
[im 29/57  brain]
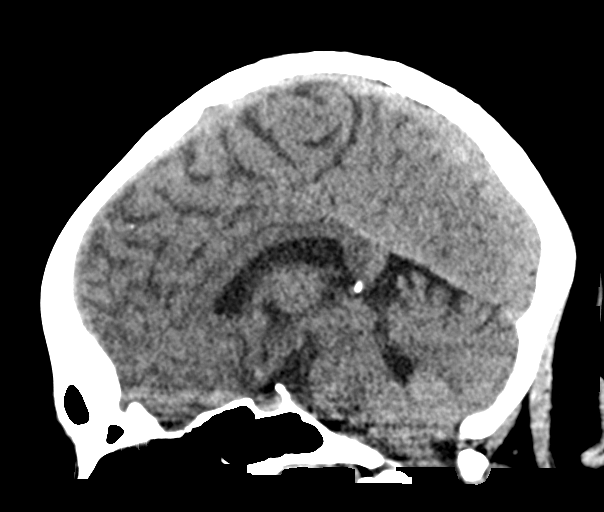
[im 38/57  brain]
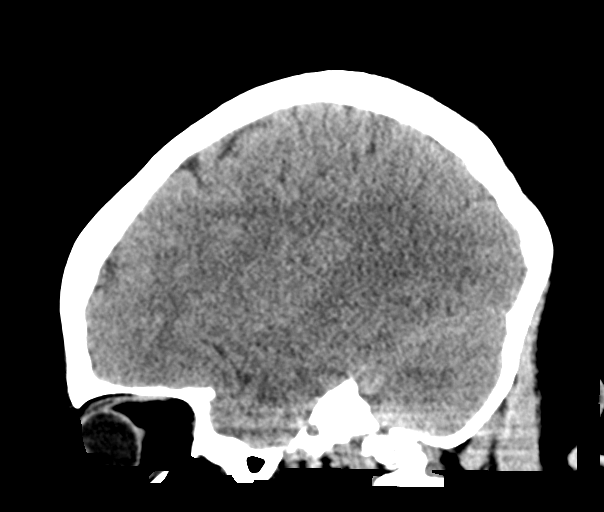

[Series 7: true axial · axial · 0.33mm/px · z∈[-135,-10]mm · 8 of 54 slices shown, 10 images]
[im 6/54  brain]
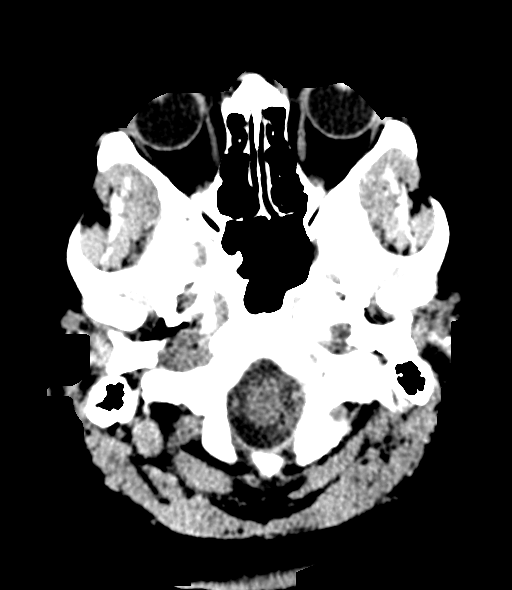
[im 6/54  bone]
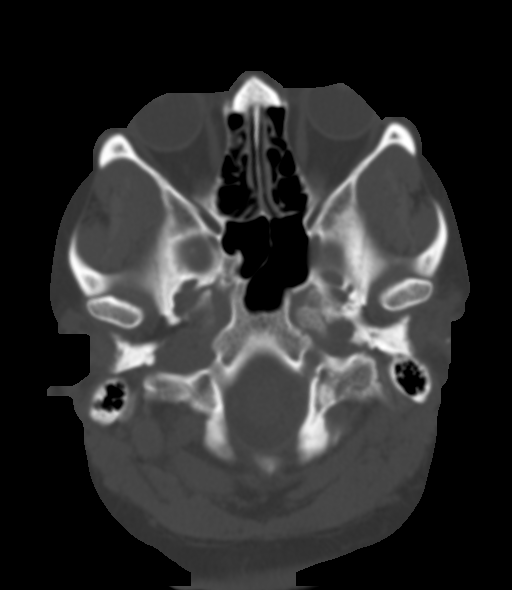
[im 12/54  brain]
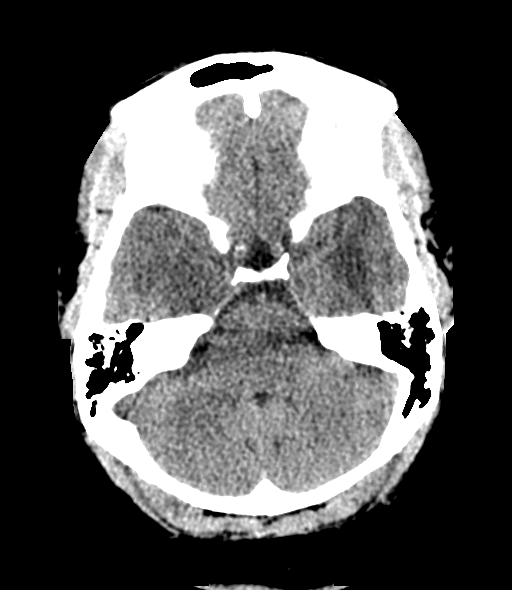
[im 18/54  brain]
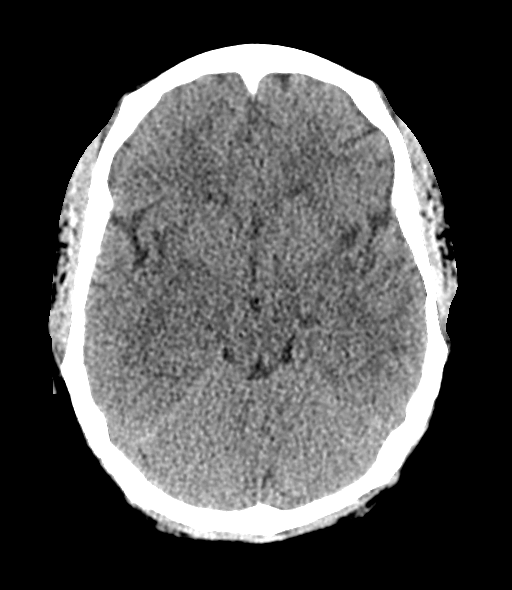
[im 24/54  brain]
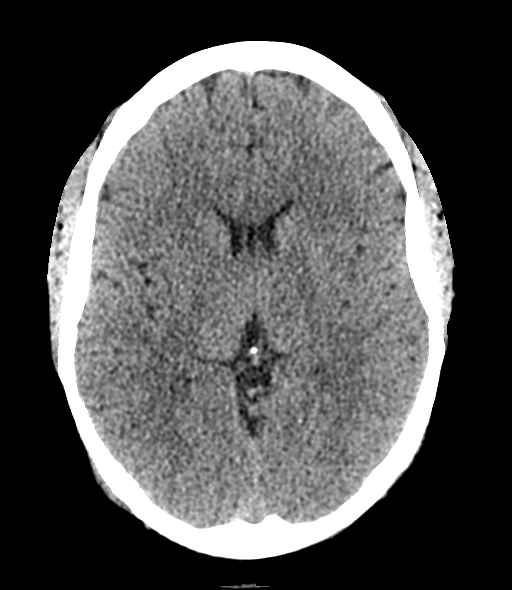
[im 30/54  brain]
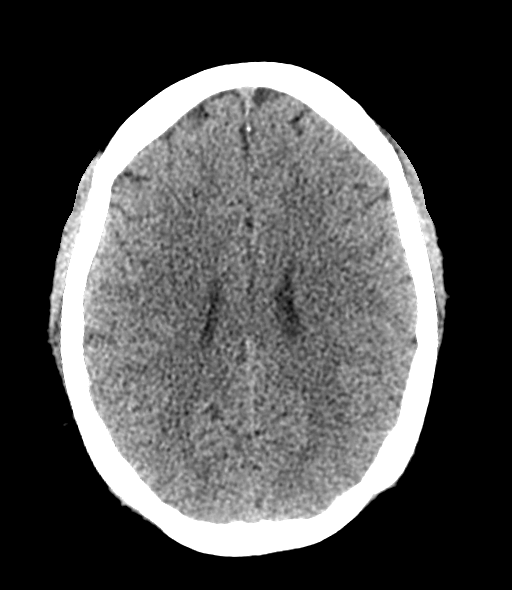
[im 30/54  bone]
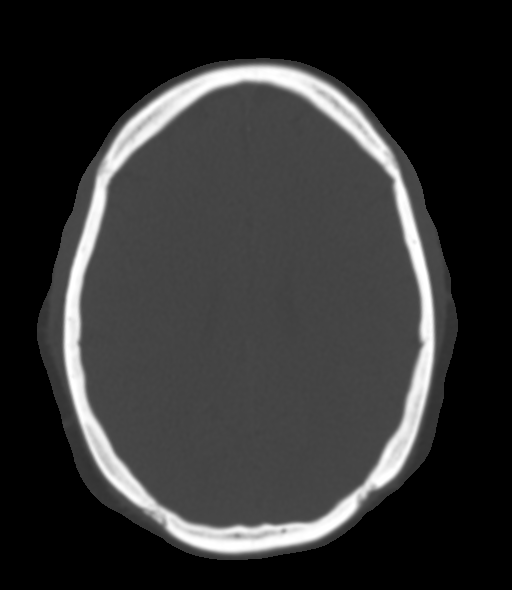
[im 36/54  brain]
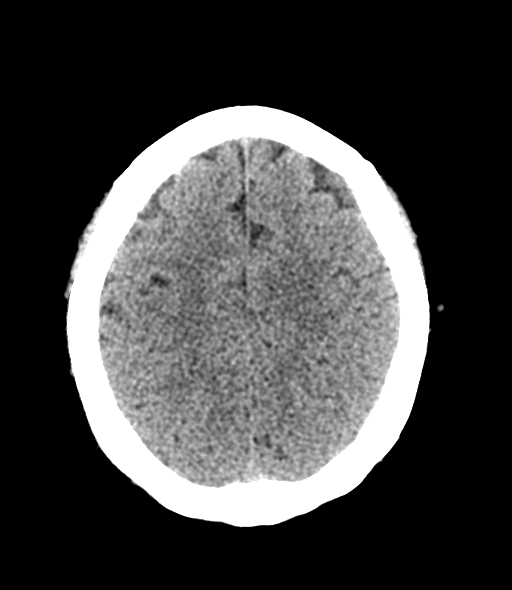
[im 42/54  brain]
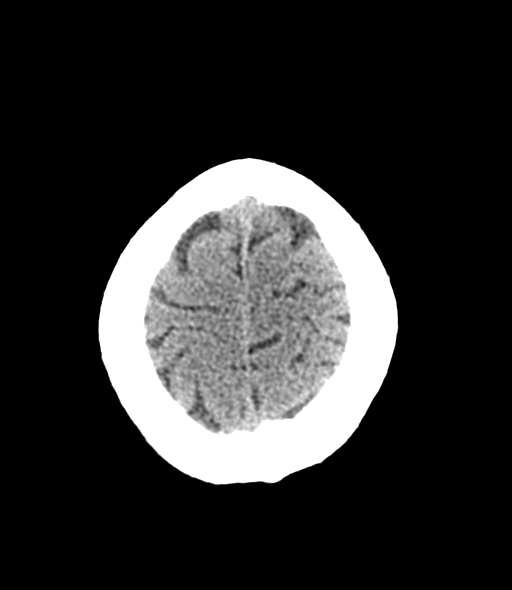
[im 48/54  brain]
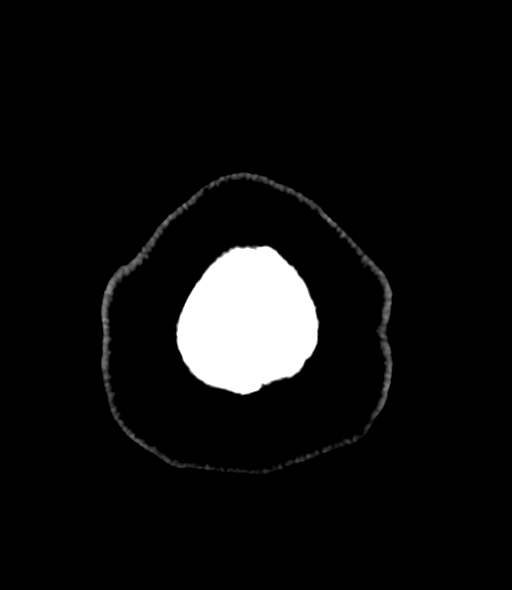

[14 of 47 positions shown; findings below may reference images not displayed]

FINDINGS: Brain: No evidence of acute infarction, hemorrhage, hydrocephalus,
extra-axial collection or mass lesion/mass effect.

Vascular: No hyperdense vessel or unexpected calcification.

Skull: Normal. Negative for fracture or focal lesion.

Sinuses/Orbits: No acute finding.

Other: None.
IMPRESSION: No acute intracranial pathology.
